# Patient Record
Sex: Female | Born: 1950 | Race: Black or African American | Hispanic: No | Marital: Married | Smoking: Never smoker
Health system: Southern US, Community
[De-identification: ages and names within clinical notes are randomized; demographics above are authoritative.]

## PROBLEM LIST (undated history)

## (undated) DIAGNOSIS — N189 Chronic kidney disease, unspecified: Secondary | ICD-10-CM

## (undated) DIAGNOSIS — I10 Essential (primary) hypertension: Secondary | ICD-10-CM

## (undated) DIAGNOSIS — R011 Cardiac murmur, unspecified: Secondary | ICD-10-CM

## (undated) DIAGNOSIS — I639 Cerebral infarction, unspecified: Secondary | ICD-10-CM

## (undated) DIAGNOSIS — M1990 Unspecified osteoarthritis, unspecified site: Secondary | ICD-10-CM

## (undated) HISTORY — PX: BRAIN SURGERY: SHX531

## (undated) HISTORY — PX: BUNIONECTOMY: SHX129

## (undated) HISTORY — PX: ANTERIOR CRUCIATE LIGAMENT REPAIR: SHX115

## (undated) HISTORY — PX: ABDOMINAL HYSTERECTOMY: SHX81

## (undated) HISTORY — PX: BACK SURGERY: SHX140

## (undated) HISTORY — PX: ARTHROPLASTY: SHX135

## (undated) HISTORY — PX: ANTERIOR CERVICAL DECOMP/DISCECTOMY FUSION: SHX1161

---

## 2018-12-17 ENCOUNTER — Encounter (HOSPITAL_BASED_OUTPATIENT_CLINIC_OR_DEPARTMENT_OTHER): Payer: Self-pay | Admitting: Emergency Medicine

## 2018-12-17 ENCOUNTER — Emergency Department (HOSPITAL_BASED_OUTPATIENT_CLINIC_OR_DEPARTMENT_OTHER): Payer: No Typology Code available for payment source

## 2018-12-17 ENCOUNTER — Emergency Department (HOSPITAL_BASED_OUTPATIENT_CLINIC_OR_DEPARTMENT_OTHER)
Admission: EM | Admit: 2018-12-17 | Discharge: 2018-12-17 | Disposition: A | Discharge status: Home / Self Care | Payer: No Typology Code available for payment source | Attending: Emergency Medicine | Admitting: Emergency Medicine

## 2018-12-17 ENCOUNTER — Other Ambulatory Visit: Payer: Self-pay

## 2018-12-17 DIAGNOSIS — I10 Essential (primary) hypertension: Secondary | ICD-10-CM | POA: Diagnosis not present

## 2018-12-17 DIAGNOSIS — J181 Lobar pneumonia, unspecified organism: Secondary | ICD-10-CM | POA: Insufficient documentation

## 2018-12-17 DIAGNOSIS — R05 Cough: Secondary | ICD-10-CM | POA: Diagnosis present

## 2018-12-17 DIAGNOSIS — J189 Pneumonia, unspecified organism: Secondary | ICD-10-CM

## 2018-12-17 HISTORY — DX: Essential (primary) hypertension: I10

## 2018-12-17 HISTORY — DX: Cerebral infarction, unspecified: I63.9

## 2018-12-17 LAB — BASIC METABOLIC PANEL
Anion gap: 8 (ref 5–15)
BUN: 17 mg/dL (ref 8–23)
CO2: 24 mmol/L (ref 22–32)
Calcium: 9.3 mg/dL (ref 8.9–10.3)
Chloride: 104 mmol/L (ref 98–111)
Creatinine, Ser: 1.13 mg/dL — ABNORMAL HIGH (ref 0.44–1.00)
GFR calc Af Amer: 57 mL/min — ABNORMAL LOW (ref >60)
GFR calc non Af Amer: 50 mL/min — ABNORMAL LOW (ref >60)
Glucose, Bld: 113 mg/dL — ABNORMAL HIGH (ref 70–99)
Potassium: 3.6 mmol/L (ref 3.5–5.1)
Sodium: 136 mmol/L (ref 135–145)

## 2018-12-17 LAB — I-STAT CG4 LACTIC ACID, ED: Lactic Acid, Venous: 1.41 mmol/L (ref 0.5–1.9)

## 2018-12-17 LAB — CBC WITH DIFFERENTIAL/PLATELET
Abs Immature Granulocytes: 0.04 10*3/uL (ref 0.00–0.07)
Basophils Absolute: 0 10*3/uL (ref 0.0–0.1)
Basophils Relative: 0 %
Eosinophils Absolute: 0.1 10*3/uL (ref 0.0–0.5)
Eosinophils Relative: 0 %
HEMATOCRIT: 41 % (ref 36.0–46.0)
Hemoglobin: 13.4 g/dL (ref 12.0–15.0)
Immature Granulocytes: 0 %
Lymphocytes Relative: 6 %
Lymphs Abs: 0.7 10*3/uL (ref 0.7–4.0)
MCH: 29 pg (ref 26.0–34.0)
MCHC: 32.7 g/dL (ref 30.0–36.0)
MCV: 88.7 fL (ref 80.0–100.0)
MONO ABS: 0.9 10*3/uL (ref 0.1–1.0)
Monocytes Relative: 8 %
Neutro Abs: 10 10*3/uL — ABNORMAL HIGH (ref 1.7–7.7)
Neutrophils Relative %: 86 %
Platelets: 192 10*3/uL (ref 150–400)
RBC: 4.62 MIL/uL (ref 3.87–5.11)
RDW: 13.2 % (ref 11.5–15.5)
WBC: 11.7 10*3/uL — ABNORMAL HIGH (ref 4.0–10.5)
nRBC: 0 % (ref 0.0–0.2)

## 2018-12-17 MED ORDER — SODIUM CHLORIDE 0.9 % IV BOLUS
1000.0000 mL | Freq: Once | INTRAVENOUS | Status: AC
  Administered 2018-12-17: 1000 mL via INTRAVENOUS

## 2018-12-17 MED ORDER — AMOXICILLIN-POT CLAVULANATE 875-125 MG PO TABS: 1.0000 | ORAL_TABLET | Freq: Two times a day (BID) | ORAL | 0 refills | Status: AC

## 2018-12-17 MED ORDER — ONDANSETRON 4 MG PO TBDP: 4.0000 mg | ORAL_TABLET | Freq: Three times a day (TID) | ORAL | 0 refills | Status: DC | PRN

## 2018-12-17 MED ORDER — AZITHROMYCIN 250 MG PO TABS: 250.0000 mg | ORAL_TABLET | Freq: Every day | ORAL | 0 refills | Status: DC

## 2018-12-17 MED ORDER — FLUCONAZOLE 150 MG PO TABS: 150.0000 mg | ORAL_TABLET | Freq: Every day | ORAL | 0 refills | Status: AC

## 2018-12-17 NOTE — ED Notes (Signed)
Pt states she has been having generalized body aches, fever and cough x 3 days. She states she has poor appetite, but denies nausea, vomiting or diarrhea. Pt is hoarse when speaking. States she is coughing up yellow mucus. She states she has chest and nasal congestion.

## 2018-12-17 NOTE — ED Triage Notes (Signed)
Body aches with headache, fever and coughing x 3 days.

## 2018-12-17 NOTE — ED Notes (Signed)
ED Provider at bedside. 

## 2018-12-17 NOTE — ED Notes (Signed)
Blood cultures drawn and at bedside.

## 2018-12-17 NOTE — Discharge Instructions (Addendum)
Drink plenty of fluids, at least 8 glasses daily

## 2018-12-17 NOTE — ED Provider Notes (Signed)
[facility] EMERGENCY DEPARTMENT Provider Note   CSN: [id number] Arrival date & time: 12/17/18  1157     History   Chief Complaint Chief Complaint  Patient presents with  . Generalized Body Aches    HPI Whitney Hess is a 67 y.o. female.  HPI   67 yo F with h/o HTN, CVA here w/ generalized weakness, cough, fever. Pt reports that for the last several days, she's had acute onset of body aches, cough, and fatigue. She's been trying to manage it at home but notes that her sx are worsening so she's here today. Pt states her sx started around 3 days ago, ar eprogressively worsening. Her cough is productive of yellow mucus. She's had poor appetite but no nausea or vomiting. Her cough, SOB are worse w/ movement, exposure to cold. No alleviating factors. 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 No h/o lung problems. She notes her BP was low at home but is now improved, denies any syncope or lightheadedness.  Past Medical History:  Diagnosis Date  . Hypertension   . Stroke [facility](HCC)     There are no active problems to display for this patient.   Past Surgical History:  Procedure Laterality Date  . ABDOMINAL HYSTERECTOMY    . BRAIN SURGERY       OB History   No obstetric history on file.      Home Medications    Prior to Admission medications   Medication Sig Start Date End Date Taking? Authorizing Provider  amoxicillin-clavulanate (AUGMENTIN) 875-125 MG tablet Take 1 tablet by mouth every 12 (twelve) hours for 7 days. 12/17/18 12/24/18  Shaune PollackIsaacs, Aquil Duhe, MD  azithromycin (ZITHROMAX) 250 MG tablet Take 1 tablet (250 mg total) by mouth daily. Take first 2 tablets together, then 1 every day until finished. 12/17/18   Shaune PollackIsaacs, Dolph Tavano, MD  fluconazole (DIFLUCAN) 150 MG tablet Take 1 tablet (150 mg total) by mouth daily for 1 day. 12/17/18 12/18/18  Shaune PollackIsaacs, Guinn Delarosa, MD  ondansetron (ZOFRAN ODT) 4 MG disintegrating tablet Take 1 tablet (4 mg total) by mouth  every 8 (eight) hours as needed for nausea or vomiting. 12/17/18   Shaune PollackIsaacs, Brenda Samano, MD    Family History History reviewed. No pertinent family history.  Social History Social History   Tobacco Use  . Smoking status: Never Smoker  . Smokeless tobacco: Never Used  Substance Use Topics  . Alcohol use: Never    Frequency: Never  . Drug use: Never     Allergies   Codeine   Review of Systems Review of Systems  Constitutional: Positive for fatigue. Negative for chills and fever.  HENT: Positive for congestion, rhinorrhea and sore throat.   Eyes: Negative for visual disturbance.  Respiratory: Positive for cough. Negative for shortness of breath and wheezing.   Cardiovascular: Negative for chest pain and leg swelling.  Gastrointestinal: Negative for abdominal pain, diarrhea, nausea and vomiting.  Genitourinary: Negative for dysuria, flank pain, vaginal bleeding and vaginal discharge.  Musculoskeletal: Negative for neck pain.  Skin: Negative for rash.  Allergic/Immunologic: Negative for immunocompromised state.  Neurological: Positive for weakness. Negative for syncope and headaches.  Hematological: Does not bruise/bleed easily.  All other systems reviewed and are negative.    Physical Exam Updated Vital Signs BP (!) 110/58 (BP Location: Right Arm)   Pulse 86   Temp 98.7 F (37.1 C) (Oral)   Resp 16   Ht 5\' 9"  (1.753 m)   Wt 97.1 kg  SpO2 97%   BMI 31.60 kg/m   Physical Exam Vitals signs and nursing note reviewed.  Constitutional:      General: She is not in acute distress.    Appearance: She is well-developed.  HENT:     Head: Normocephalic and atraumatic.     Comments: Mildly dry MM. Posterior pharyngeal erythema w/o tonsillar swelling or exudates.     Mouth/Throat:     Mouth: Mucous membranes are dry.  Eyes:     Conjunctiva/sclera: Conjunctivae normal.  Neck:     Musculoskeletal: Neck supple.  Cardiovascular:     Rate and Rhythm: Normal rate and regular  rhythm.     Heart sounds: Normal heart sounds. No murmur. No friction rub.  Pulmonary:     Effort: Pulmonary effort is normal. No respiratory distress.     Breath sounds: Examination of the right-upper field reveals rales. Rales present. No wheezing.     Comments: Normal WOB, speaking in full sentences Abdominal:     General: There is no distension.     Palpations: Abdomen is soft.     Tenderness: There is no abdominal tenderness.  Skin:    General: Skin is warm.     Capillary Refill: Capillary refill takes less than 2 seconds.  Neurological:     Mental Status: She is alert and oriented to person, place, and time.     Motor: No abnormal muscle tone.      ED Treatments / Results  Labs (all labs ordered are listed, but only abnormal results are displayed) Labs Reviewed  CBC WITH DIFFERENTIAL/PLATELET - Abnormal; Notable for the following components:      Result Value   WBC 11.7 (*)    Neutro Abs 10.0 (*)    All other components within normal limits  BASIC METABOLIC PANEL - Abnormal; Notable for the following components:   Glucose, Bld 113 (*)    Creatinine, Ser 1.13 (*)    GFR calc non Af Amer 50 (*)    GFR calc Af Amer 57 (*)    All other components within normal limits  I-STAT CG4 LACTIC ACID, ED  I-STAT CG4 LACTIC ACID, ED    EKG None  Radiology Dg Chest 2 View  Result Date: 12/17/2018 CLINICAL DATA:  Shortness of breath, cough, congestion, fever and headache for 3 days, history hypertension, stroke EXAM: CHEST - 2 VIEW COMPARISON:  None FINDINGS: Enlargement of cardiac silhouette with pulmonary vascular congestion. Mediastinal contours normal. Minimal RIGHT basilar atelectasis. Lungs otherwise clear. No pleural effusion or pneumothorax. Prior cervical and lumbar spine fusion procedures. Degenerative disc disease changes thoracic spine. No acute osseous findings. IMPRESSION: Enlargement of cardiac silhouette with pulmonary vascular congestion. Minimal RIGHT basilar  atelectasis. Electronically Signed   By: Ulyses Southward M.D.   On: 12/17/2018 13:42    Procedures Procedures (including critical care time)  Medications Ordered in ED Medications  sodium chloride 0.9 % bolus 1,000 mL (0 mLs Intravenous Stopped 12/17/18 1342)     Initial Impression / Assessment and Plan / ED Course  I have reviewed the triage vital signs and the nursing notes.  Pertinent labs & imaging results that were available during my care of the patient were reviewed by me and considered in my medical decision making (see chart for details).    67 yo F here w/ cough, SOB, body aches. On arrival here, she is very well appearing and in NAD. She has multiple sick contacts at work. Labs show mild leukocytosis but normal  LA, reassuring lytes. Doubt sepsis. She is afebrile and HDS here. CXR does show possible mild infiltrate, will tx as PNA to be safe. Suspect viral URi with possible early CAP. Pt requesting d/c and I think it's reasonable given low-risk presentation and labs. PSI is Risk Class II. Shared decision making employed and pt states she would prefer outpt management. Will have her push fluids, hold her antihypertensives, and return with any worsenin sx.  Final Clinical Impressions(s) / ED Diagnoses   Final diagnoses:  Pneumonia of right lower lobe due to infectious organism [facility](HCC)    ED Discharge Orders         Ordered    amoxicillin-clavulanate (AUGMENTIN) 875-125 MG tablet  Every 12 hours     12/17/18 1426    ondansetron (ZOFRAN ODT) 4 MG disintegrating tablet  Every 8 hours PRN     12/17/18 1426    azithromycin (ZITHROMAX) 250 MG tablet  Daily     12/17/18 1426    fluconazole (DIFLUCAN) 150 MG tablet  Daily     12/17/18 1434           Shaune PollackIsaacs, Randol Zumstein, MD 12/17/18 1547

## 2018-12-17 NOTE — ED Notes (Signed)
Patient transported to X-ray 

## 2020-06-12 IMAGING — CR DG CHEST 2V
2 series · 2 of 2 positions shown · non-contrast
Comparison: None

CLINICAL DATA: Shortness of breath, cough, congestion, fever and
headache for 3 days, history hypertension, stroke

EXAM:
CHEST - 2 VIEW

[w chest pa]
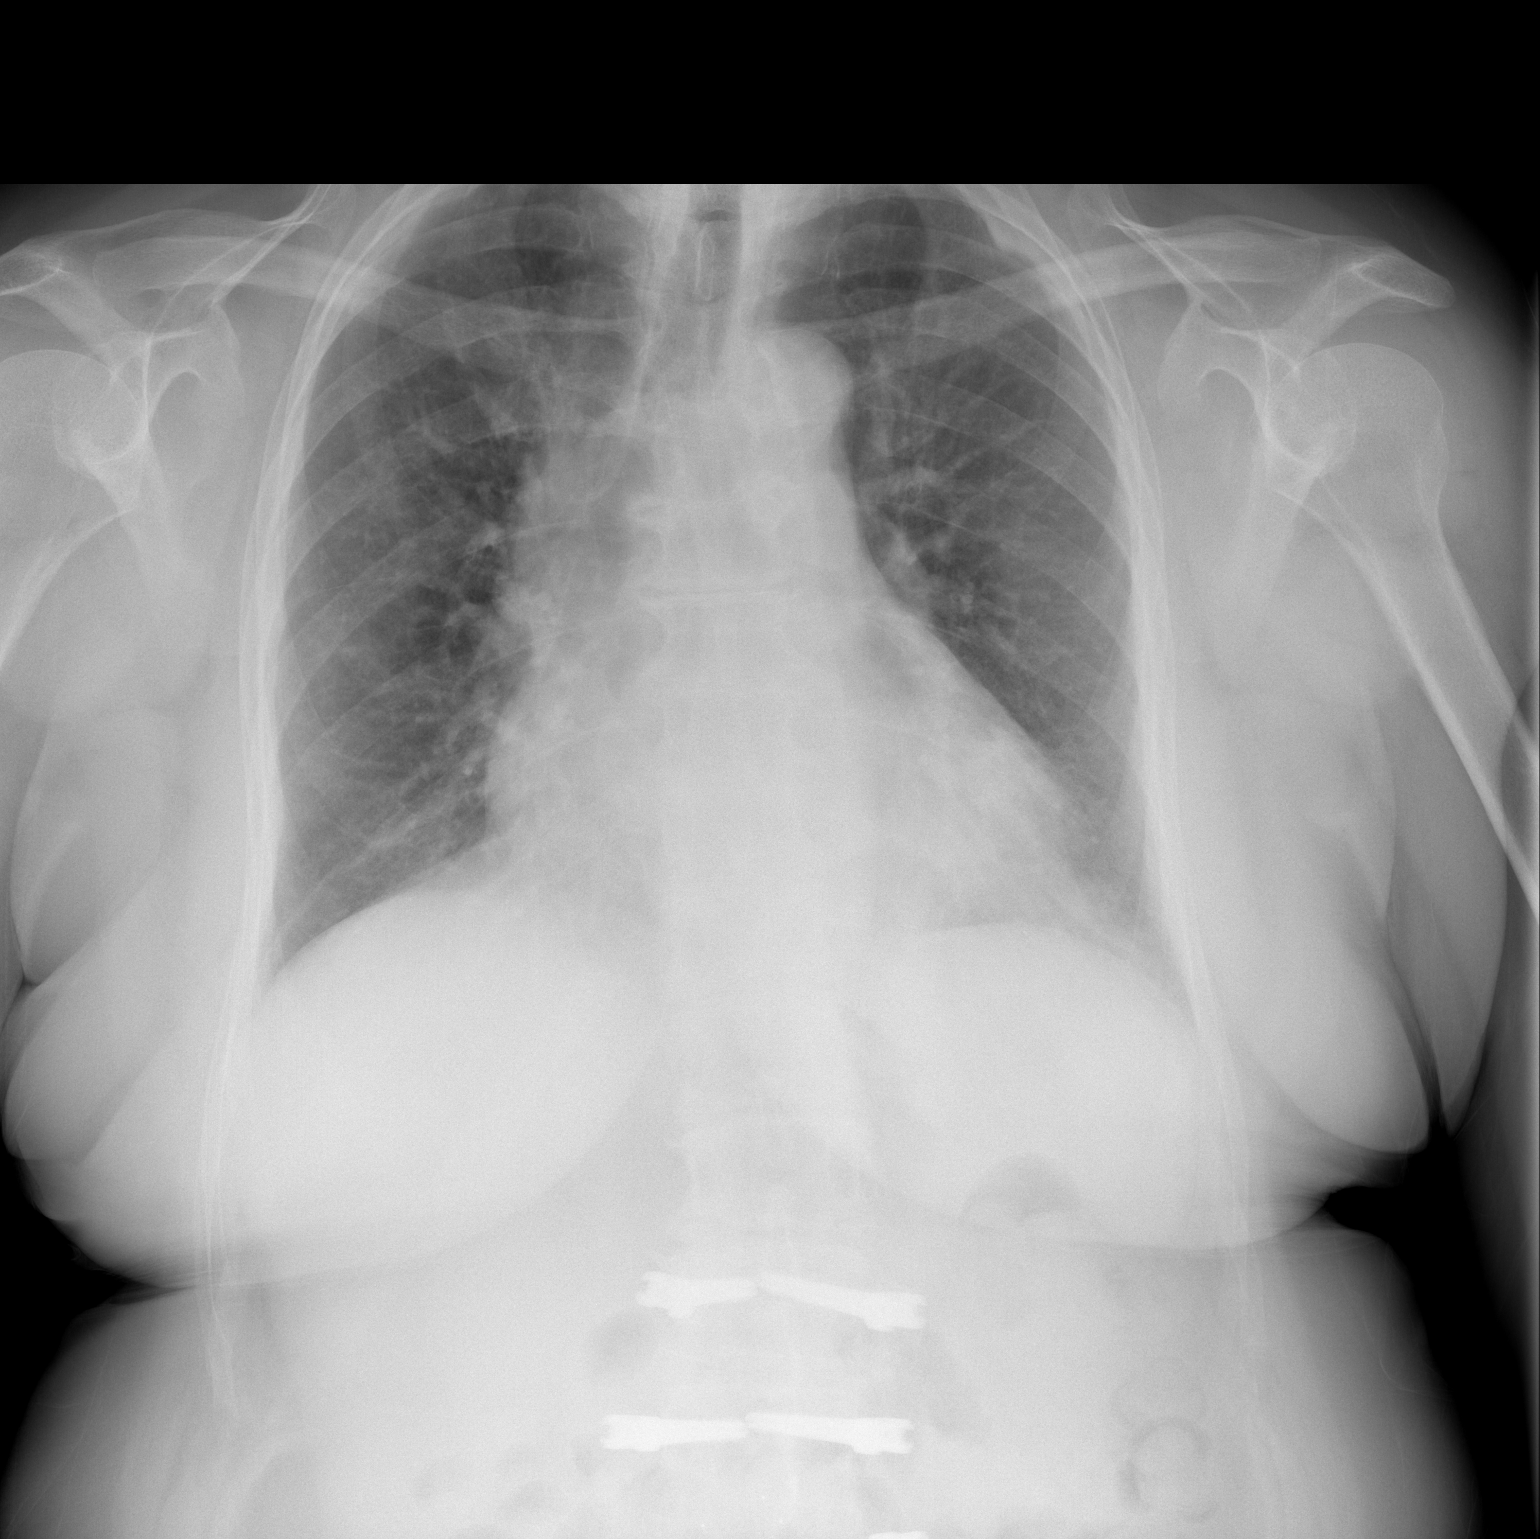

[w chest lat]
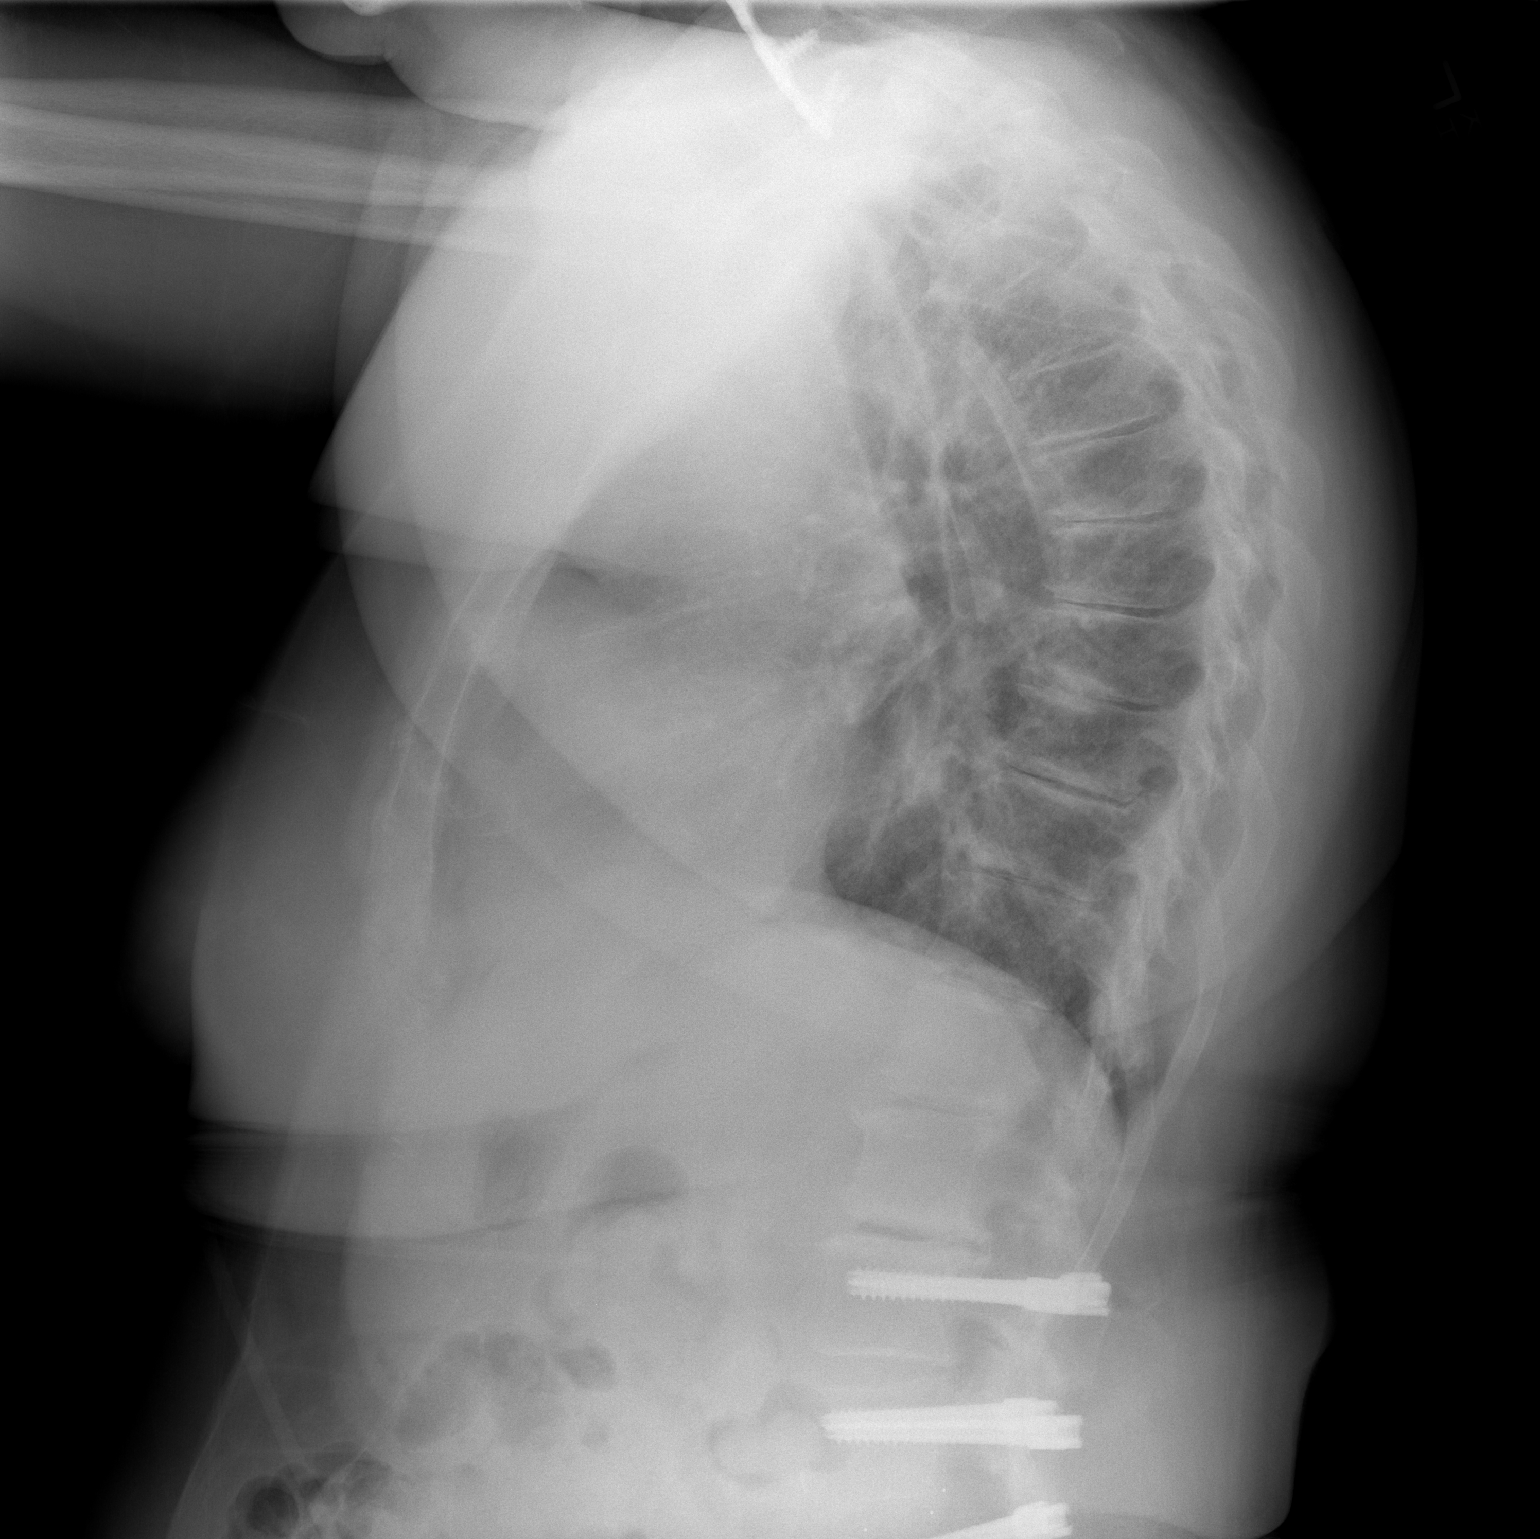

[2 of 2 positions shown; findings below may reference images not displayed]

FINDINGS: Enlargement of cardiac silhouette with pulmonary vascular
congestion.

Mediastinal contours normal.

Minimal RIGHT basilar atelectasis.

Lungs otherwise clear.

No pleural effusion or pneumothorax.

Prior cervical and lumbar spine fusion procedures.

Degenerative disc disease changes thoracic spine.

No acute osseous findings.
IMPRESSION: Enlargement of cardiac silhouette with pulmonary vascular
congestion.

Minimal RIGHT basilar atelectasis.

## 2021-08-10 ENCOUNTER — Encounter (HOSPITAL_BASED_OUTPATIENT_CLINIC_OR_DEPARTMENT_OTHER): Payer: Self-pay | Admitting: Emergency Medicine

## 2021-08-10 ENCOUNTER — Emergency Department (HOSPITAL_BASED_OUTPATIENT_CLINIC_OR_DEPARTMENT_OTHER)
Admission: EM | Admit: 2021-08-10 | Discharge: 2021-08-10 | Disposition: A | Discharge status: Home / Self Care | Payer: Medicare HMO | Attending: Emergency Medicine | Admitting: Emergency Medicine

## 2021-08-10 ENCOUNTER — Other Ambulatory Visit: Payer: Self-pay

## 2021-08-10 DIAGNOSIS — I10 Essential (primary) hypertension: Secondary | ICD-10-CM | POA: Insufficient documentation

## 2021-08-10 DIAGNOSIS — Z79899 Other long term (current) drug therapy: Secondary | ICD-10-CM | POA: Insufficient documentation

## 2021-08-10 DIAGNOSIS — Z20822 Contact with and (suspected) exposure to covid-19: Secondary | ICD-10-CM | POA: Diagnosis not present

## 2021-08-10 DIAGNOSIS — R519 Headache, unspecified: Secondary | ICD-10-CM | POA: Diagnosis present

## 2021-08-10 DIAGNOSIS — M542 Cervicalgia: Secondary | ICD-10-CM | POA: Insufficient documentation

## 2021-08-10 DIAGNOSIS — Z7982 Long term (current) use of aspirin: Secondary | ICD-10-CM | POA: Insufficient documentation

## 2021-08-10 MED ORDER — METHOCARBAMOL 500 MG PO TABS: 500.0000 mg | ORAL_TABLET | Freq: Three times a day (TID) | ORAL | 0 refills | Status: DC | PRN

## 2021-08-10 MED ORDER — KETOROLAC TROMETHAMINE 60 MG/2ML IM SOLN
30.0000 mg | Freq: Once | INTRAMUSCULAR | Status: AC
  Administered 2021-08-10: 30 mg via INTRAMUSCULAR
  Filled 2021-08-10: qty 2

## 2021-08-10 MED ORDER — METHOCARBAMOL 500 MG PO TABS
500.0000 mg | ORAL_TABLET | Freq: Once | ORAL | Status: AC
  Administered 2021-08-10: 500 mg via ORAL
  Filled 2021-08-10: qty 1

## 2021-08-10 NOTE — ED Provider Notes (Signed)
[facility] EMERGENCY DEPARTMENT Provider Note   CSN: [id number] Arrival date & time: 08/10/21  0503     History Chief Complaint  Patient presents with   Headache    Whitney Hess is a 70 y.o. female.   Headache Pain location:  Occipital Quality:  Dull, sharp and stabbing Radiates to:  Does not radiate Severity currently:  Unable to specify Severity at highest:  Unable to specify Onset quality:  Unable to specify Duration:  2 days Timing:  Intermittent Progression:  Waxing and waning Chronicity:  New Similar to prior headaches: yes   Context: emotional stress   Context: not activity and not eating   Relieved by:  Nothing Worsened by:  Nothing Ineffective treatments:  None tried Associated symptoms: no abdominal pain and no eye pain       Past Medical History:  Diagnosis Date   Hypertension    Stroke (HCC)     There are no problems to display for this patient.   Past Surgical History:  Procedure Laterality Date   ABDOMINAL HYSTERECTOMY     BACK SURGERY     BRAIN SURGERY       OB History   No obstetric history on file.     No family history on file.  Social History   Tobacco Use   Smoking status: Never   Smokeless tobacco: Never  Vaping Use   Vaping Use: Never used  Substance Use Topics   Alcohol use: Never   Drug use: Never    Home Medications Prior to Admission medications   Medication Sig Start Date End Date Taking? Authorizing Provider  aspirin 325 MG tablet Take 325 mg by mouth daily.   Yes [provider]  Biotin 1 MG CAPS Take by mouth.   Yes [provider]  calcium carbonate (OSCAL) 1500 (600 Ca) MG TABS tablet Take by mouth 2 (two) times daily with a meal.   Yes [provider]  colchicine 0.6 MG tablet Take 0.6 mg by mouth daily.   Yes [provider]  Flaxseed, Linseed, (FLAXSEED OIL PO) Take by mouth.   Yes [provider]  furosemide (LASIX) 40 MG tablet Take 40 mg by  mouth.   Yes [provider]  L-Lysine HCl 1000 MG TABS Take by mouth.   Yes [provider]  loratadine (CLARITIN) 10 MG tablet Take 10 mg by mouth daily.   Yes [provider]  losartan (COZAAR) 100 MG tablet Take 100 mg by mouth daily.   Yes [provider]  Magnesium 250 MG TABS Take by mouth.   Yes [provider]  methocarbamol (ROBAXIN) 500 MG tablet Take 1 tablet (500 mg total) by mouth every 8 (eight) hours as needed for muscle spasms. 08/10/21  Yes Jawon Dipiero, Barbara Cower, MD  metoprolol succinate (TOPROL-XL) 50 MG 24 hr tablet Take 50 mg by mouth daily. Take with or immediately following a meal.   Yes [provider]  pantoprazole (PROTONIX) 40 MG tablet Take 40 mg by mouth daily.   Yes [provider]  potassium chloride SA (KLOR-CON) 20 MEQ tablet Take 20 mEq by mouth 2 (two) times daily.   Yes [provider]  pravastatin (PRAVACHOL) 20 MG tablet Take 20 mg by mouth daily.   Yes [provider]  Selenium 200 MCG CAPS Take by mouth.   Yes [provider]  vitamin C (ASCORBIC ACID) 500 MG tablet Take 500 mg by mouth daily.   Yes [provider]  Zinc 50 MG CAPS Take by mouth.   Yes [provider]  azithromycin (ZITHROMAX) 250 MG tablet Take 1 tablet (250 mg total) by mouth daily. Take first 2 tablets together, then 1 every day until finished. 12/17/18   Shaune Pollack, MD  ondansetron (ZOFRAN ODT) 4 MG disintegrating tablet Take 1 tablet (4 mg total) by mouth every 8 (eight) hours as needed for nausea or vomiting. 12/17/18   Shaune Pollack, MD    Allergies    Codeine  Review of Systems   Review of Systems  Eyes:  Negative for pain.  Gastrointestinal:  Negative for abdominal pain.  Neurological:  Positive for headaches.  All other systems reviewed and are negative.  Physical Exam Updated Vital Signs BP (!) 149/84   Pulse 62   Temp 98.6 F (37 C) (Oral)   Resp 18   Ht 5'  9.5" (1.765 m)   Wt 98 kg   SpO2 99%   BMI 31.44 kg/m   Physical Exam Vitals and nursing note reviewed.  Constitutional:      Appearance: She is well-developed.  HENT:     Head: Normocephalic and atraumatic.  Eyes:     General: No visual field deficit. Cardiovascular:     Rate and Rhythm: Normal rate and regular rhythm.  Pulmonary:     Effort: No respiratory distress.     Breath sounds: No stridor.  Abdominal:     General: There is no distension.  Musculoskeletal:        General: Tenderness (neck) present.     Cervical back: Normal range of motion. No rigidity.  Skin:    General: Skin is warm and dry.  Neurological:     Mental Status: She is alert.     GCS: GCS eye subscore is 4. GCS verbal subscore is 5. GCS motor subscore is 6.     Cranial Nerves: No cranial nerve deficit, dysarthria or facial asymmetry.     Sensory: No sensory deficit.     Motor: No weakness.     Coordination: Coordination normal.     Gait: Gait normal.    ED Results / Procedures / Treatments   Labs (all labs ordered are listed, but only abnormal results are displayed) Labs Reviewed  SARS CORONAVIRUS 2 (TAT 6-24 HRS)    EKG None  Radiology No results found.  Procedures Procedures   Medications Ordered in ED Medications  ketorolac (TORADOL) injection 30 mg (30 mg Intramuscular Given 08/10/21 0642)  methocarbamol (ROBAXIN) tablet 500 mg (500 mg Oral Given 08/10/21 6761)    ED Course  I have reviewed the triage vital signs and the nursing notes.  Pertinent labs & imaging results that were available during my care of the patient were reviewed by me and considered in my medical decision making (see chart for details).    MDM Rules/Calculators/A&P                           No neurologic changes to suggest stroke or cervical issues.  No infectious symptoms to suggest meningitis.  Has had a lot of stress, difficulty sleeping and has been sleeping in a chair taking a lot of cat naps so  seems like it is probalby muscular. Considered occipital neuralgia, however pushing on occiput did not elicit symptoms.  Will treat symptomatically with pcp follow up.   Final Clinical Impression(s) / ED Diagnoses Final diagnoses:  Neck pain    Rx /  DC Orders ED Discharge Orders          Ordered    methocarbamol (ROBAXIN) 500 MG tablet  Every 8 hours PRN        08/10/21 0646             Truitt Cruey, Barbara Cower, MD 08/10/21 [phone]

## 2021-08-10 NOTE — ED Triage Notes (Signed)
Reports HA since Thursday "from the back of the neck to top of the head". Pt states worse when laying down. Denies hx of migraines or frequent HA. Denies photophobia, visual changes or stiffness in neck. Reports flare up of "fisherman's shingles" in R hand.

## 2021-08-11 LAB — SARS CORONAVIRUS 2 (TAT 6-24 HRS): SARS Coronavirus 2: NEGATIVE

## 2022-07-07 ENCOUNTER — Encounter (HOSPITAL_BASED_OUTPATIENT_CLINIC_OR_DEPARTMENT_OTHER): Payer: Self-pay | Admitting: Emergency Medicine

## 2022-07-07 ENCOUNTER — Other Ambulatory Visit: Payer: Self-pay

## 2022-07-07 ENCOUNTER — Emergency Department (HOSPITAL_BASED_OUTPATIENT_CLINIC_OR_DEPARTMENT_OTHER)
Admission: EM | Admit: 2022-07-07 | Discharge: 2022-07-07 | Disposition: A | Discharge status: Home / Self Care | Payer: Medicare HMO | Attending: Emergency Medicine | Admitting: Emergency Medicine

## 2022-07-07 DIAGNOSIS — R300 Dysuria: Secondary | ICD-10-CM | POA: Diagnosis present

## 2022-07-07 DIAGNOSIS — N3001 Acute cystitis with hematuria: Secondary | ICD-10-CM | POA: Diagnosis not present

## 2022-07-07 DIAGNOSIS — Z7982 Long term (current) use of aspirin: Secondary | ICD-10-CM | POA: Diagnosis not present

## 2022-07-07 LAB — URINALYSIS, MICROSCOPIC (REFLEX)
RBC / HPF: >50 RBC/hpf (ref 0–5)
WBC, UA: >50 WBC/hpf (ref 0–5)

## 2022-07-07 LAB — CBC WITH DIFFERENTIAL/PLATELET
Abs Immature Granulocytes: 0.02 10*3/uL (ref 0.00–0.07)
Basophils Absolute: 0 10*3/uL (ref 0.0–0.1)
Basophils Relative: 1 %
Eosinophils Absolute: 0.2 10*3/uL (ref 0.0–0.5)
Eosinophils Relative: 2 %
HCT: 38.9 % (ref 36.0–46.0)
Hemoglobin: 12.7 g/dL (ref 12.0–15.0)
Immature Granulocytes: 0 %
Lymphocytes Relative: 16 %
Lymphs Abs: 1.3 10*3/uL (ref 0.7–4.0)
MCH: 28.9 pg (ref 26.0–34.0)
MCHC: 32.6 g/dL (ref 30.0–36.0)
MCV: 88.6 fL (ref 80.0–100.0)
Monocytes Absolute: 0.7 10*3/uL (ref 0.1–1.0)
Monocytes Relative: 8 %
Neutro Abs: 6 10*3/uL (ref 1.7–7.7)
Neutrophils Relative %: 73 %
Platelets: 221 10*3/uL (ref 150–400)
RBC: 4.39 MIL/uL (ref 3.87–5.11)
RDW: 13.8 % (ref 11.5–15.5)
WBC: 8.2 10*3/uL (ref 4.0–10.5)
nRBC: 0 % (ref 0.0–0.2)

## 2022-07-07 LAB — URINALYSIS, ROUTINE W REFLEX MICROSCOPIC

## 2022-07-07 LAB — BASIC METABOLIC PANEL
Anion gap: 5 (ref 5–15)
BUN: 15 mg/dL (ref 8–23)
CO2: 28 mmol/L (ref 22–32)
Calcium: 9.1 mg/dL (ref 8.9–10.3)
Chloride: 107 mmol/L (ref 98–111)
Creatinine, Ser: 1.1 mg/dL — ABNORMAL HIGH (ref 0.44–1.00)
GFR, Estimated: 53 mL/min — ABNORMAL LOW (ref >60)
Glucose, Bld: 97 mg/dL (ref 70–99)
Potassium: 4.1 mmol/L (ref 3.5–5.1)
Sodium: 140 mmol/L (ref 135–145)

## 2022-07-07 MED ORDER — CEPHALEXIN 500 MG PO CAPS: 500.0000 mg | ORAL_CAPSULE | Freq: Three times a day (TID) | ORAL | 0 refills | Status: DC

## 2022-07-07 NOTE — ED Notes (Signed)
Discharge instructions reviewed with patient. Patient verbalizes understanding, no further questions at this time. Medications/prescriptions and follow up information provided. No acute distress noted at time of departure.  

## 2022-07-07 NOTE — ED Notes (Signed)
Lab notified of urine orders

## 2022-07-07 NOTE — ED Triage Notes (Signed)
Dyuria x 2 days.  Pt noticed blood in her urine.  No known fevers.  Burning with urination, bladder spasms, and urethral issues.  No back issues.

## 2022-07-07 NOTE — ED Provider Notes (Signed)
[facility] EMERGENCY DEPARTMENT Provider Note   CSN: [id number] Arrival date & time: 07/07/22  1006     History  Chief Complaint  Patient presents with   Dysuria    Whitney Hess is a 71 y.o. female with a past medical history of urinary incontinence, rectocele status post bladder sling presenting today with concern for UTI she reports that she has noted some dysuria for 2 days and this morning she noticed blood in her urine.  No frequency or urgency.  No fever, chills, back pain, nausea, vomiting.   Dysuria Associated symptoms: no fever and no vaginal discharge        Home Medications Prior to Admission medications   Medication Sig Start Date End Date Taking? Authorizing Provider  Alpha-Lipoic Acid 300 MG CAPS Take by mouth.    [provider]  ascorbic acid (VITAMIN C) 500 MG tablet Take by mouth.    [provider]  aspirin 325 MG tablet Take 325 mg by mouth daily.    [provider]  Biotin 1 MG CAPS Take by mouth.    [provider]  Biotin 1000 MCG tablet Take by mouth.    [provider]  calcium carbonate (OSCAL) 1500 (600 Ca) MG TABS tablet Take by mouth 2 (two) times daily with a meal.    [provider]  colchicine 0.6 MG tablet Take 0.6 mg by mouth daily.    [provider]  Flaxseed, Linseed, (FLAXSEED OIL PO) Take by mouth.    [provider]  furosemide (LASIX) 40 MG tablet Take 40 mg by mouth.    [provider]  L-Lysine HCl 1000 MG TABS Take by mouth.    [provider]  loratadine (CLARITIN) 10 MG tablet Take 10 mg by mouth daily.    [provider]  losartan (COZAAR) 100 MG tablet Take 100 mg by mouth daily.    [provider]  Magnesium 250 MG TABS Take by mouth.    [provider]  metoprolol succinate (TOPROL-XL) 50 MG 24 hr tablet Take 50 mg by mouth daily. Take with or immediately following a meal.    [provider]   potassium chloride SA (KLOR-CON) 20 MEQ tablet Take 20 mEq by mouth 2 (two) times daily.    [provider]  pravastatin (PRAVACHOL) 20 MG tablet Take 20 mg by mouth daily.    [provider]  Selenium 200 MCG CAPS Take by mouth.    [provider]  vitamin C (ASCORBIC ACID) 500 MG tablet Take 500 mg by mouth daily.    [provider]  Zinc 50 MG CAPS Take by mouth.    [provider]  zinc gluconate 50 MG tablet Take by mouth.    [provider]      Allergies    Codeine, Amlodipine besylate, Sulfamethoxazole-trimethoprim, and Tramadol    Review of Systems   Review of Systems  Constitutional:  Negative for chills and fever.  Genitourinary:  Positive for dysuria and hematuria. Negative for vaginal bleeding, vaginal discharge and vaginal pain.    Physical Exam Updated Vital Signs BP 127/81   Pulse 60   Temp 97.8 F (36.6 C) (Oral)   Resp 18   Ht 5' 9.5" (1.765 m)   Wt 98 kg   SpO2 99%   BMI 31.44 kg/m  Physical Exam Vitals and nursing note reviewed.  Constitutional:      Appearance: Normal appearance.  HENT:  Head: Normocephalic and atraumatic.  Eyes:     General: No scleral icterus.    Conjunctiva/sclera: Conjunctivae normal.  Pulmonary:     Effort: Pulmonary effort is normal. No respiratory distress.  Abdominal:     General: Abdomen is flat.     Palpations: Abdomen is soft.     Tenderness: There is no abdominal tenderness.  Genitourinary:    Comments: Not indicated Skin:    Findings: No rash.  Neurological:     Mental Status: She is alert.  Psychiatric:        Mood and Affect: Mood normal.     ED Results / Procedures / Treatments   Labs (all labs ordered are listed, but only abnormal results are displayed) Labs Reviewed  URINALYSIS, ROUTINE W REFLEX MICROSCOPIC - Abnormal; Notable for the following components:      Result Value   Color, Urine BROWN (*)    APPearance TURBID (*)    Glucose, UA    (*)    Value: TEST NOT REPORTED DUE TO COLOR INTERFERENCE OF URINE PIGMENT   Hgb urine dipstick   (*)    Value: TEST NOT REPORTED DUE TO COLOR INTERFERENCE OF URINE PIGMENT   Bilirubin Urine   (*)    Value: TEST NOT REPORTED DUE TO COLOR INTERFERENCE OF URINE PIGMENT   Ketones, ur   (*)    Value: TEST NOT REPORTED DUE TO COLOR INTERFERENCE OF URINE PIGMENT   Protein, ur   (*)    Value: TEST NOT REPORTED DUE TO COLOR INTERFERENCE OF URINE PIGMENT   Nitrite   (*)    Value: TEST NOT REPORTED DUE TO COLOR INTERFERENCE OF URINE PIGMENT   Leukocytes,Ua   (*)    Value: TEST NOT REPORTED DUE TO COLOR INTERFERENCE OF URINE PIGMENT   All other components within normal limits  BASIC METABOLIC PANEL - Abnormal; Notable for the following components:   Creatinine, Ser 1.10 (*)    GFR, Estimated 53 (*)    All other components within normal limits  URINALYSIS, MICROSCOPIC (REFLEX) - Abnormal; Notable for the following components:   Bacteria, UA FEW (*)    Non Squamous Epithelial PRESENT (*)    All other components within normal limits  URINE CULTURE  CBC WITH DIFFERENTIAL/PLATELET    EKG None  Radiology No results found.  Procedures Procedures   Medications Ordered in ED Medications - No data to display  ED Course/ Medical Decision Making/ A&P                           Medical Decision Making Amount and/or Complexity of Data Reviewed Labs: ordered.   This patient presents to the ED for concern of dysuria and hematuria.  Differential includes but is not limited to cystitis, pyelonephritis, malignancy, nephritic syndrome, nephrotic syndrome.   This is not an exhaustive differential.    Past Medical History / Co-morbidities / Social History: Bladder sling, recurrent UTI   Additional history: Per external chart review, patient was following with urology after her bladder sling procedure.  She had recurrent urinary tract infections requiring treatment.   Physical Exam: No  pertinent exam findings  Lab Tests: I ordered, and personally interpreted labs.   -UA with bacteriuria and hematuria. -No white blood cell count -Creatinine 1.10, was 1.14 in August of last year.    Disposition: 71 year old female presented with dysuria and hematuria.  Urinalysis seems hematuric with signs of infection.  She has no  with pyelonephritis and a normal white count.  Afebrile.  Doubt nephrolithiasis causing her symptoms at this time.  After consideration of her work-up, I feel she is stable for discharge home.   I discussed this case with my attending physician Dr. Dalene Seltzer who cosigned this note including patient's presenting symptoms, physical exam, and planned diagnostics and interventions. Attending physician stated agreement with plan or made changes to plan which were implemented.     Final Clinical Impression(s) / ED Diagnoses Final diagnoses:  Acute cystitis with hematuria    Rx / DC Orders ED Discharge Orders          Ordered    cephALEXin (KEFLEX) 500 MG capsule  3 times daily,   Status:  Discontinued        07/07/22 1216    cephALEXin (KEFLEX) 500 MG capsule  3 times daily        07/07/22 1216           Results and diagnoses were explained to the patient. Return precautions discussed in full. Patient had no additional questions and expressed complete understanding.   This chart was dictated using voice recognition software.  Despite best efforts to proofread,  errors can occur which can change the documentation meaning.    Woodroe Chen 07/07/22 1221    Alvira Monday, MD 07/08/22 2354

## 2022-07-07 NOTE — Discharge Instructions (Addendum)
You do have a UTI.  Take the antibiotics as prescribed.  They may upset your stomach so take them with food.  Return with any worsening symptoms, especially fevers, chills, abdominal pain, back pain, nausea or vomiting.  If you continue to have these urinary tract infections you should see your primary care or your urologist.  It was a pleasure to meet you and I hope that you feel better.

## 2022-07-09 LAB — URINE CULTURE: Culture: >=100000 — AB

## 2022-07-10 ENCOUNTER — Telehealth: Payer: Self-pay

## 2022-07-10 NOTE — Telephone Encounter (Signed)
Post ED Visit - Positive Culture Follow-up  Culture report reviewed by antimicrobial stewardship pharmacist: [facility] [x]  [place], [place].D. []  [place], Pharm.D., BCPS AQ-ID []  , Pharm.D., BCPS []  Celedonio Miyamoto, Pharm.D., BCPS []  [place], Garvin Fila.D., BCPS, AAHIVP []  , Pharm.D., BCPS, AAHIVP []  Georgina Pillion, PharmD, BCPS []  , PharmD, BCPS []  [place], PharmD, BCPS []  [address], PharmD []  , PharmD, BCPS []  Estella Husk, PharmD  Pharmacy Team []  Lysle Pearl, PharmD []  , PharmD []  Phillips Climes, PharmD []  , Rph []  Agapito Games) , PharmD []  Verlan Friends, PharmD []  , PharmD []  Mervyn Gay, PharmD []  , PharmD []  Vinnie Level, PharmD []  [facility], PharmD []  , PharmD []  Len Childs, PharmD   Positive urine culture Treated with Cephalexin, organism sensitive to the same and no further patient follow-up is required at this time.  07/10/2022, 4:47 PM

## 2024-04-05 ENCOUNTER — Emergency Department (HOSPITAL_BASED_OUTPATIENT_CLINIC_OR_DEPARTMENT_OTHER)
Admission: EM | Admit: 2024-04-05 | Discharge: 2024-04-05 | Disposition: A | Discharge status: Home / Self Care | Attending: Emergency Medicine | Admitting: Emergency Medicine

## 2024-04-05 ENCOUNTER — Encounter (HOSPITAL_BASED_OUTPATIENT_CLINIC_OR_DEPARTMENT_OTHER): Payer: Self-pay

## 2024-04-05 ENCOUNTER — Other Ambulatory Visit (HOSPITAL_BASED_OUTPATIENT_CLINIC_OR_DEPARTMENT_OTHER): Payer: Self-pay

## 2024-04-05 ENCOUNTER — Other Ambulatory Visit: Payer: Self-pay

## 2024-04-05 ENCOUNTER — Emergency Department (HOSPITAL_BASED_OUTPATIENT_CLINIC_OR_DEPARTMENT_OTHER)

## 2024-04-05 DIAGNOSIS — J40 Bronchitis, not specified as acute or chronic: Secondary | ICD-10-CM | POA: Diagnosis not present

## 2024-04-05 DIAGNOSIS — R0602 Shortness of breath: Secondary | ICD-10-CM | POA: Diagnosis not present

## 2024-04-05 DIAGNOSIS — R058 Other specified cough: Secondary | ICD-10-CM | POA: Diagnosis present

## 2024-04-05 DIAGNOSIS — Z7982 Long term (current) use of aspirin: Secondary | ICD-10-CM | POA: Diagnosis not present

## 2024-04-05 LAB — CBC WITH DIFFERENTIAL/PLATELET
Abs Immature Granulocytes: 0 10*3/uL (ref 0.00–0.07)
Basophils Absolute: 0.1 10*3/uL (ref 0.0–0.1)
Basophils Relative: 1 %
Eosinophils Absolute: 0.2 10*3/uL (ref 0.0–0.5)
Eosinophils Relative: 4 %
HCT: 38.7 % (ref 36.0–46.0)
Hemoglobin: 12.7 g/dL (ref 12.0–15.0)
Immature Granulocytes: 0 %
Lymphocytes Relative: 27 %
Lymphs Abs: 1.3 10*3/uL (ref 0.7–4.0)
MCH: 28.9 pg (ref 26.0–34.0)
MCHC: 32.8 g/dL (ref 30.0–36.0)
MCV: 88.2 fL (ref 80.0–100.0)
Monocytes Absolute: 0.7 10*3/uL (ref 0.1–1.0)
Monocytes Relative: 14 %
Neutro Abs: 2.7 10*3/uL (ref 1.7–7.7)
Neutrophils Relative %: 54 %
Platelets: 220 10*3/uL (ref 150–400)
RBC: 4.39 MIL/uL (ref 3.87–5.11)
RDW: 13.5 % (ref 11.5–15.5)
WBC: 4.9 10*3/uL (ref 4.0–10.5)
nRBC: 0 % (ref 0.0–0.2)

## 2024-04-05 LAB — BASIC METABOLIC PANEL WITH GFR
Anion gap: 8 (ref 5–15)
BUN: 16 mg/dL (ref 8–23)
CO2: 24 mmol/L (ref 22–32)
Calcium: 9 mg/dL (ref 8.9–10.3)
Chloride: 104 mmol/L (ref 98–111)
Creatinine, Ser: 1.04 mg/dL — ABNORMAL HIGH (ref 0.44–1.00)
GFR, Estimated: 56 mL/min — ABNORMAL LOW (ref >60)
Glucose, Bld: 130 mg/dL — ABNORMAL HIGH (ref 70–99)
Potassium: 4 mmol/L (ref 3.5–5.1)
Sodium: 136 mmol/L (ref 135–145)

## 2024-04-05 LAB — RESP PANEL BY RT-PCR (RSV, FLU A&B, COVID)  RVPGX2
Influenza A by PCR: NEGATIVE
Influenza B by PCR: NEGATIVE
Resp Syncytial Virus by PCR: NEGATIVE
SARS Coronavirus 2 by RT PCR: NEGATIVE

## 2024-04-05 LAB — BRAIN NATRIURETIC PEPTIDE: B Natriuretic Peptide: 59.6 pg/mL (ref 0.0–100.0)

## 2024-04-05 MED ORDER — AZITHROMYCIN 250 MG PO TABS
250.0000 mg | ORAL_TABLET | Freq: Every day | ORAL | 0 refills | Status: DC
  Filled 2024-04-05: qty 6, 5d supply, fill #0

## 2024-04-05 NOTE — ED Triage Notes (Signed)
 Cough, congestion, fevers since Monday. States gets [facility] w exertion, denies chest pain

## 2024-04-05 NOTE — ED Provider Notes (Signed)
 [facility] Provider Note   CSN: [id number] Arrival date & time: 04/05/24  0749     History  Chief Complaint  Patient presents with   URI    Whitney Hess is a 73 y.o. female.  She is here with complaint of cough productive of some sputum, unknown color, subjective fever, nasal congestion sinus pressure.  Having difficulty lying down flat and needed to sleep in recliner last night.  Persistent cough.  No sick contacts or recent travel.  She said she feels like she might have bronchitis or pneumonia.  The history is provided by the patient.  URI Presenting symptoms: congestion, cough, fatigue, fever and rhinorrhea   Cough:    Cough characteristics:  Productive   Sputum characteristics:  Unable to specify   Severity:  Moderate   Onset quality:  Gradual   Timing:  Intermittent   Progression:  Worsening   Chronicity:  New Associated symptoms: sinus pain   Risk factors: no immunosuppression, no recent travel and no sick contacts        Home Medications Prior to Admission medications   Medication Sig Start Date End Date Taking? Authorizing Provider  Alpha-Lipoic Acid 300 MG CAPS Take by mouth.    [provider]  ascorbic acid (VITAMIN C) 500 MG tablet Take by mouth.    [provider]  aspirin 325 MG tablet Take 325 mg by mouth daily.    [provider]  Biotin 1 MG CAPS Take by mouth.    [provider]  Biotin 1000 MCG tablet Take by mouth.    [provider]  calcium carbonate (OSCAL) 1500 (600 Ca) MG TABS tablet Take by mouth 2 (two) times daily with a meal.    [provider]  cephALEXin (KEFLEX) 500 MG capsule Take 1 capsule (500 mg total) by mouth 3 (three) times daily. 07/07/22   [place], Madison A, PA-C  colchicine 0.6 MG tablet Take 0.6 mg by mouth daily.    [provider]  Flaxseed, Linseed, (FLAXSEED OIL PO) Take by mouth.    [provider]   furosemide (LASIX) 40 MG tablet Take 40 mg by mouth.    [provider]  L-Lysine HCl 1000 MG TABS Take by mouth.    [provider]  loratadine (CLARITIN) 10 MG tablet Take 10 mg by mouth daily.    [provider]  losartan (COZAAR) 100 MG tablet Take 100 mg by mouth daily.    [provider]  Magnesium 250 MG TABS Take by mouth.    [provider]  metoprolol succinate (TOPROL-XL) 50 MG 24 hr tablet Take 50 mg by mouth daily. Take with or immediately following a meal.    [provider]  potassium chloride SA (KLOR-CON) 20 MEQ tablet Take 20 mEq by mouth 2 (two) times daily.    [provider]  pravastatin (PRAVACHOL) 20 MG tablet Take 20 mg by mouth daily.    [provider]  Selenium 200 MCG CAPS Take by mouth.    [provider]  vitamin C (ASCORBIC ACID) 500 MG tablet Take 500 mg by mouth daily.    [provider]  Zinc 50 MG CAPS Take by mouth.    [provider]  zinc gluconate 50 MG tablet Take by mouth.    [provider]      Allergies    Codeine, Amlodipine besylate, Sulfamethoxazole-trimethoprim, and Tramadol    Review of  Systems   Review of Systems  Constitutional:  Positive for fatigue and fever.  HENT:  Positive for congestion, rhinorrhea and sinus pain.   Respiratory:  Positive for cough.   Cardiovascular:  Negative for chest pain.  Gastrointestinal:  Negative for abdominal pain.    Physical Exam Updated Vital Signs BP 138/77   Pulse 70   Temp 98.4 F (36.9 C) (Oral)   Resp 20   Ht 5\' 7"  (1.702 m)   Wt 94.8 kg   SpO2 99%   BMI 32.73 kg/m  Physical Exam Vitals and nursing note reviewed.  Constitutional:      General: She is not in acute distress.    Appearance: Normal appearance. She is well-developed.  HENT:     Head: Normocephalic and atraumatic.     Nose: Rhinorrhea present.  Eyes:     Conjunctiva/sclera: Conjunctivae normal.  Cardiovascular:      Rate and Rhythm: Normal rate and regular rhythm.     Heart sounds: No murmur heard. Pulmonary:     Effort: Pulmonary effort is normal. No respiratory distress.     Breath sounds: Rhonchi (r base) present.  Abdominal:     Palpations: Abdomen is soft.     Tenderness: There is no abdominal tenderness.  Musculoskeletal:        General: No deformity.     Cervical back: Neck supple.  Skin:    General: Skin is warm and dry.     Capillary Refill: Capillary refill takes less than 2 seconds.  Neurological:     General: No focal deficit present.     Mental Status: She is alert.     ED Results / Procedures / Treatments   Labs (all labs ordered are listed, but only abnormal results are displayed) Labs Reviewed  BASIC METABOLIC PANEL WITH GFR - Abnormal; Notable for the following components:      Result Value   Glucose, Bld 130 (*)    Creatinine, Ser 1.04 (*)    GFR, Estimated 56 (*)    All other components within normal limits  RESP PANEL BY RT-PCR (RSV, FLU A&B, COVID)  [id number]  CBC WITH DIFFERENTIAL/PLATELET  BRAIN NATRIURETIC PEPTIDE    EKG None  Radiology DG Chest 2 View Result Date: 04/05/2024 CLINICAL DATA:  Cough.  Shortness of breath. EXAM: CHEST - 2 VIEW COMPARISON:  08/06/2020. FINDINGS: Bilateral lung fields are clear. Bilateral costophrenic angles are clear. Normal cardio-mediastinal silhouette. No acute osseous abnormalities. Lower cervical and upper lumbar spinal fixation hardware noted. The soft tissues are within normal limits. IMPRESSION: No active cardiopulmonary disease. Electronically Signed   By: Jules Schick M.D.   On: 04/05/2024 09:03    Procedures Procedures    Medications Ordered in ED Medications - No data to display  ED Course/ Medical Decision Making/ A&P Clinical Course as of 04/05/24 1704  Wed Apr 05, 2024  0902 Chest x-ray interpreted by me as no acute infiltrate.  Lab work unremarkable including BNP COVID and flu.  Will treat with  antibiotics for possible bronchitis. [MB]  [id number] Patient offered inhaler, she is declining this.  Recommended she continue her allergy medication [MB]    Clinical Course User Index [MB] Terrilee Files, MD                                 Medical Decision Making Amount and/or Complexity of Data Reviewed Labs: ordered. Radiology: ordered.  Risk  Prescription drug management.   This patient complains of cough and chest congestion fever shortness of breath; this involves an extensive number of treatment Options and is a complaint that carries with it a high risk of complications and morbidity. The differential includes pneumonia, COVID, flu, bronchitis, CHF  I ordered, reviewed and interpreted labs, which included COVID and flu negative, CBC with normal white count, chemistries unremarkable, BNP normal I ordered imaging studies which included chest x-ray and I independently    visualized and interpreted imaging which showed no acute findings Previous records obtained and reviewed in epic including recent PCP visits Cardiac monitoring reviewed, normal sinus rhythm Social determinants considered, no significant barriers Critical Interventions: None  After the interventions stated above, I reevaluated the patient and found patient to be satting well on room air in no distress Admission and further testing considered, no indications for admission or further workup at this time.  Will cover her with antibiotics for possible bronchitis.  Recommended close follow-up with PCP.  She is declining an inhaler.  Return instructions discussed         Final Clinical Impression(s) / ED Diagnoses Final diagnoses:  Bronchitis    Rx / DC Orders ED Discharge Orders          Ordered    azithromycin (ZITHROMAX) 250 MG tablet  Daily        04/05/24 0910              Terrilee Files, MD 04/05/24 [phone]

## 2024-06-13 NOTE — Patient Instructions (Signed)
 SURGICAL WAITING ROOM VISITATION  Patients having surgery or a procedure may have no more than 2 support people in the waiting area - these visitors may rotate.    Children under the age of 71 must have an adult with them who is not the patient.  Visitors with respiratory illnesses are discouraged from visiting and should remain at home.  If the patient needs to stay at the hospital during part of their recovery, the visitor guidelines for inpatient rooms apply. Pre-op nurse will coordinate an appropriate time for 1 support person to accompany patient in pre-op.  This support person may not rotate.    Please refer to the [facility] website for the visitor guidelines for Inpatients (after your surgery is over and you are in a regular room).       Your procedure is scheduled on: 06/27/24   Report to [facility] Main Entrance    Report to admitting at : 7:30 AM   Call this number if you have problems the morning of surgery [phone]   Do not eat food :After Midnight.   After Midnight you may have the following liquids until : 7:00 AM DAY OF SURGERY  Water Non-Citrus Juices (without pulp, NO RED-Apple, White grape, White cranberry) Black Coffee (NO MILK/CREAM OR CREAMERS, sugar ok)  Clear Tea (NO MILK/CREAM OR CREAMERS, sugar ok) regular and decaf                             Plain Jell-O (NO RED)                                           Fruit ices (not with fruit pulp, NO RED)                                     Popsicles (NO RED)                                                               Sports drinks like Gatorade (NO RED)   The day of surgery:  Drink ONE (1) Pre-Surgery Clear Ensure at A: 7:00 M the morning of surgery. Drink in one sitting. Do not sip.  This drink was given to you during your hospital  pre-op appointment visit. Nothing else to drink after completing the  Pre-Surgery Clear Ensure or G2.          If you have questions, please contact your  surgeon's office.  FOLLOW ANY ADDITIONAL PRE OP INSTRUCTIONS YOU RECEIVED FROM YOUR SURGEON'S OFFICE!!!     Oral Hygiene is also important to reduce your risk of infection.                                    Remember - BRUSH YOUR TEETH THE MORNING OF SURGERY WITH YOUR REGULAR TOOTHPASTE  DENTURES WILL BE REMOVED PRIOR TO SURGERY PLEASE DO NOT APPLY Poly grip OR ADHESIVES!!!   Do NOT smoke after Midnight  Stop all vitamins and herbal supplements 7 days before surgery.   Take these medicines the morning of surgery with A SIP OF WATER: cetirizine,metoprolol,colchicine,pantoprazole.  DO NOT TAKE ANY ORAL DIABETIC MEDICATIONS DAY OF YOUR SURGERY                              You may not have any metal on your body including hair pins, jewelry, and body piercing             Do not wear make-up, lotions, powders, perfumes/cologne, or deodorant  Do not wear nail polish including gel and S&S, artificial/acrylic nails, or any other type of covering on natural nails including finger and toenails. If you have artificial nails, gel coating, etc. that needs to be removed by a nail salon please have this removed prior to surgery or surgery may need to be canceled/ delayed if the surgeon/ anesthesia feels like they are unable to be safely monitored.   Do not shave  48 hours prior to surgery.    Do not bring valuables to the hospital. [facility] IS NOT             RESPONSIBLE   FOR VALUABLES.   Contacts, glasses, dentures or bridgework may not be worn into surgery.   Bring small overnight bag day of surgery.   DO NOT BRING YOUR HOME MEDICATIONS TO THE HOSPITAL. PHARMACY WILL DISPENSE MEDICATIONS LISTED ON YOUR MEDICATION LIST TO YOU DURING YOUR ADMISSION IN THE HOSPITAL!    Patients discharged on the day of surgery will not be allowed to drive home.  Someone NEEDS to stay with you for the first 24 hours after anesthesia.   Special Instructions: Bring a copy of your healthcare power of  attorney and living will documents the day of surgery if you haven't scanned them before.              Please read over the following fact sheets you were given: IF YOU HAVE QUESTIONS ABOUT YOUR PRE-OP INSTRUCTIONS PLEASE CALL [phone]   If you received a COVID test during your pre-op visit  it is requested that you wear a mask when out in public, stay away from anyone that may not be feeling well and notify your surgeon if you develop symptoms. If you test positive for Covid or have been in contact with anyone that has tested positive in the last 10 days please notify you surgeon.     Do NOT apply benzoyl peroxide gel on the day of surgery.  Pre-operative 5 CHG Bath Instructions   You can play a key role in reducing the risk of infection after surgery. Your skin needs to be as free of germs as possible. You can reduce the number of germs on your skin by washing with CHG (chlorhexidine gluconate) soap before surgery. CHG is an antiseptic soap that kills germs and continues to kill germs even after washing.   DO NOT use if you have an allergy to chlorhexidine/CHG or antibacterial soaps. If your skin becomes reddened or irritated, stop using the CHG and notify one of our RNs at [phone].   Please shower with the CHG soap starting 4 days before surgery using the following schedule:     Please keep in mind the following:  DO NOT shave, including legs and underarms, starting the day of your first shower.   You may shave your face at any point before/day of surgery.  Place clean sheets on your bed the day you start using CHG soap. Use a clean washcloth (not used since being washed) for each shower. DO NOT sleep with pets once you start using the CHG.   CHG Shower Instructions:  If you choose to wash your hair and private area, wash first with your normal shampoo/soap.  After you use shampoo/soap, rinse your hair and body thoroughly to remove shampoo/soap residue.  Turn the water OFF  and apply about 3 tablespoons (45 ml) of CHG soap to a CLEAN washcloth.  Apply CHG soap ONLY FROM YOUR NECK DOWN TO YOUR TOES (washing for 3-5 minutes)  DO NOT use CHG soap on face, private areas, open wounds, or sores.  Pay special attention to the area where your surgery is being performed.  If you are having back surgery, having someone wash your back for you may be helpful. Wait 2 minutes after CHG soap is applied, then you may rinse off the CHG soap.  Pat dry with a clean towel  Put on clean clothes/pajamas   If you choose to wear lotion, please use ONLY the CHG-compatible lotions on the back of this paper.     Additional instructions for the day of surgery: DO NOT APPLY any lotions, deodorants, cologne, or perfumes.   Put on clean/comfortable clothes.  Brush your teeth.  Ask your nurse before applying any prescription medications to the skin.   CHG Compatible Lotions   Aveeno Moisturizing lotion  Cetaphil Moisturizing Cream  Cetaphil Moisturizing Lotion  Clairol Herbal Essence Moisturizing Lotion, Dry Skin  Clairol Herbal Essence Moisturizing Lotion, Extra Dry Skin  Clairol Herbal Essence Moisturizing Lotion, Normal Skin  Curel Age Defying Therapeutic Moisturizing Lotion with Alpha Hydroxy  Curel Extreme Care Body Lotion  Curel Soothing Hands Moisturizing Hand Lotion  Curel Therapeutic Moisturizing Cream, Fragrance-Free  Curel Therapeutic Moisturizing Lotion, Fragrance-Free  Curel Therapeutic Moisturizing Lotion, Original Formula  Eucerin Daily Replenishing Lotion  Eucerin Dry Skin Therapy Plus Alpha Hydroxy Crme  Eucerin Dry Skin Therapy Plus Alpha Hydroxy Lotion  Eucerin Original Crme  Eucerin Original Lotion  Eucerin Plus Crme Eucerin Plus Lotion  Eucerin TriLipid Replenishing Lotion  Keri Anti-Bacterial Hand Lotion  Keri Deep Conditioning Original Lotion Dry Skin Formula Softly Scented  Keri Deep Conditioning Original Lotion, Fragrance Free Sensitive Skin  Formula  Keri Lotion Fast Absorbing Fragrance Free Sensitive Skin Formula  Keri Lotion Fast Absorbing Softly Scented Dry Skin Formula  Keri Original Lotion  Keri Skin Renewal Lotion Keri Silky Smooth Lotion  Keri Silky Smooth Sensitive Skin Lotion  Nivea Body Creamy Conditioning Oil  Nivea Body Extra Enriched Lotion  Nivea Body Original Lotion  Nivea Body Sheer Moisturizing Lotion Nivea Crme  Nivea Skin Firming Lotion  NutraDerm 30 Skin Lotion  NutraDerm Skin Lotion  NutraDerm Therapeutic Skin Cream  NutraDerm Therapeutic Skin Lotion  ProShield Protective Hand Cream  Provon moisturizing lotion   Incentive Spirometer  An incentive spirometer is a tool that can help keep your lungs clear and active. This tool measures how well you are filling your lungs with each breath. Taking long deep breaths may help reverse or decrease the chance of developing breathing (pulmonary) problems (especially infection) following: A long period of time when you are unable to move or be active. BEFORE THE PROCEDURE  If the spirometer includes an indicator to show your best effort, your nurse or respiratory therapist will set it to a desired goal. If possible, sit up straight or lean slightly forward. Try  not to slouch. Hold the incentive spirometer in an upright position. INSTRUCTIONS FOR USE  Sit on the edge of your bed if possible, or sit up as far as you can in bed or on a chair. Hold the incentive spirometer in an upright position. Breathe out normally. Place the mouthpiece in your mouth and seal your lips tightly around it. Breathe in slowly and as deeply as possible, raising the piston or the ball toward the top of the column. Hold your breath for 3-5 seconds or for as long as possible. Allow the piston or ball to fall to the bottom of the column. Remove the mouthpiece from your mouth and breathe out normally. Rest for a few seconds and repeat Steps 1 through 7 at least 10 times every 1-2 hours  when you are awake. Take your time and take a few normal breaths between deep breaths. The spirometer may include an indicator to show your best effort. Use the indicator as a goal to work toward during each repetition. After each set of 10 deep breaths, practice coughing to be sure your lungs are clear. If you have an incision (the cut made at the time of surgery), support your incision when coughing by placing a pillow or rolled up towels firmly against it. Once you are able to get out of bed, walk around indoors and cough well. You may stop using the incentive spirometer when instructed by your caregiver.  RISKS AND COMPLICATIONS Take your time so you do not get dizzy or light-headed. If you are in pain, you may need to take or ask for pain medication before doing incentive spirometry. It is harder to take a deep breath if you are having pain. AFTER USE Rest and breathe slowly and easily. It can be helpful to keep track of a log of your progress. Your caregiver can provide you with a simple table to help with this. If you are using the spirometer at home, follow these instructions: SEEK MEDICAL CARE IF:  You are having difficultly using the spirometer. You have trouble using the spirometer as often as instructed. Your pain medication is not giving enough relief while using the spirometer. You develop fever of 100.5 F (38.1 C) or higher. SEEK IMMEDIATE MEDICAL CARE IF:  You cough up bloody sputum that had not been present before. You develop fever of 102 F (38.9 C) or greater. You develop worsening pain at or near the incision site. MAKE SURE YOU:  Understand these instructions. Will watch your condition. Will get help right away if you are not doing well or get worse. Document Released: 04/26/2007 Document Revised: 03/07/2012 Document Reviewed: 06/27/2007 [facility] Patient Information 2014 [place], [place].   ________________________________________________________________________

## 2024-06-14 ENCOUNTER — Other Ambulatory Visit: Payer: Self-pay

## 2024-06-14 ENCOUNTER — Encounter (HOSPITAL_COMMUNITY): Payer: Self-pay

## 2024-06-14 ENCOUNTER — Encounter (HOSPITAL_COMMUNITY)
Admission: RE | Admit: 2024-06-14 | Discharge: 2024-06-14 | Disposition: A | Discharge status: Home / Self Care | Source: Ambulatory Visit | Attending: Orthopedic Surgery | Admitting: Orthopedic Surgery

## 2024-06-14 VITALS — BP 124/75 | HR 62 | Temp 98.2°F | Ht 69.5 in | Wt 213.0 lb

## 2024-06-14 DIAGNOSIS — Z01818 Encounter for other preprocedural examination: Secondary | ICD-10-CM | POA: Diagnosis present

## 2024-06-14 DIAGNOSIS — I10 Essential (primary) hypertension: Secondary | ICD-10-CM | POA: Insufficient documentation

## 2024-06-14 HISTORY — DX: Cardiac murmur, unspecified: R01.1

## 2024-06-14 HISTORY — DX: Unspecified osteoarthritis, unspecified site: M19.90

## 2024-06-14 HISTORY — DX: Chronic kidney disease, unspecified: N18.9

## 2024-06-14 LAB — BASIC METABOLIC PANEL WITH GFR
Anion gap: 7 (ref 5–15)
BUN: 19 mg/dL (ref 8–23)
CO2: 26 mmol/L (ref 22–32)
Calcium: 9.1 mg/dL (ref 8.9–10.3)
Chloride: 105 mmol/L (ref 98–111)
Creatinine, Ser: 1.29 mg/dL — ABNORMAL HIGH (ref 0.44–1.00)
GFR, Estimated: 44 mL/min — ABNORMAL LOW (ref >60)
Glucose, Bld: 99 mg/dL (ref 70–99)
Potassium: 3.9 mmol/L (ref 3.5–5.1)
Sodium: 138 mmol/L (ref 135–145)

## 2024-06-14 LAB — CBC
HCT: 40.2 % (ref 36.0–46.0)
Hemoglobin: 12.8 g/dL (ref 12.0–15.0)
MCH: 28.4 pg (ref 26.0–34.0)
MCHC: 31.8 g/dL (ref 30.0–36.0)
MCV: 89.1 fL (ref 80.0–100.0)
Platelets: 237 10*3/uL (ref 150–400)
RBC: 4.51 MIL/uL (ref 3.87–5.11)
RDW: 13.7 % (ref 11.5–15.5)
WBC: 5.9 10*3/uL (ref 4.0–10.5)
nRBC: 0 % (ref 0.0–0.2)

## 2024-06-14 LAB — SURGICAL PCR SCREEN
MRSA, PCR: NEGATIVE
Staphylococcus aureus: NEGATIVE

## 2024-06-14 NOTE — Progress Notes (Signed)
 For Anesthesia: PCP - Lieutenant Reese., MD LOV: 01/26/24 Cardiologist - N/A  Bowel Prep reminder:  Chest x-ray -  EKG - 06/14/24 Stress Test -  ECHO - 07/29/20: [facility] Cardiac Cath -  Pacemaker/ICD device last checked: Pacemaker orders received: Device Rep notified:  Spinal Cord Stimulator: N/A  Sleep Study - N/A CPAP -   Fasting Blood Sugar - N/A Checks Blood Sugar _____ times a day Date and result of last Hgb A1c-  Last dose of GLP1 agonist- N/A GLP1 instructions:   Last dose of SGLT-2 inhibitors- N/A SGLT-2 instructions:   Blood Thinner Instructions:Aspirin 325 mg. Will be on hold 5 days before surgery. Aspirin Instructions: Last Dose:  Activity level:   Unable to go up a flight of stairs due to knee pain.    Anesthesia review: Hx: HTN,TIA,CKD 3.  Patient denies shortness of breath, fever, cough and chest pain at PAT appointment   Patient verbalized understanding of instructions that were reviewed over the telephone.

## 2024-06-15 NOTE — H&P (Cosign Needed)
 TOTAL KNEE H&P  Patient is being admitted for left total knee arthroplasty.  Therapy Plans: outpatient therapy at [facility] Disposition: Home with husband Planned DVT Prophylaxis: aspirin 81mg  BID DME needed: walker PCP: Dr. Hobert Lull - clearance received TXA: IV Allergies: bactrim - abdominal pain/itching, celebrex - hair loss, codeine - vomiting, Anesthesia Concerns: significant cervical and lumbar fusion ?? general BMI: 33.4 Last HgbA1c: not diabetic   Other: - Planning on SDD - did this with her lumbar fusion - hx of CKD - avoid NSAIDs - urinary incontinence - Patient is a retired TEFL teacher at [employer] - hydrocodone, robaxin , tylenol, meloxicam - hx of benign lung nodules  Subjective:  Chief Complaint:left knee pain.  HPI: Whitney Hess, 73 y.o. female, has a history of pain and functional disability in the left knee due to arthritis and has failed non-surgical conservative treatments for greater than 12 weeks to includeNSAID's and/or analgesics, corticosteriod injections, and activity modification.  Onset of symptoms was gradual, starting 2 years ago with gradually worsening course since that time. The patient noted no past surgery on the left knee(s).  Patient currently rates pain in the left knee(s) at 8 out of 10 with activity. Patient has worsening of pain with activity and weight bearing, pain that interferes with activities of daily living, and pain with passive range of motion.  Patient has evidence of joint space narrowing by imaging studies. There is no active infection.  There are no active problems to display for this patient.  Past Medical History:  Diagnosis Date   Arthritis    Chronic kidney disease    stage 3   Heart murmur    Hypertension    Stroke [facility])     Past Surgical History:  Procedure Laterality Date   ABDOMINAL HYSTERECTOMY     ANTERIOR CERVICAL DECOMP/DISCECTOMY FUSION     ANTERIOR CRUCIATE LIGAMENT REPAIR Left    ARTHROPLASTY  Bilateral    second toe   BACK SURGERY     BRAIN SURGERY     BUNIONECTOMY Bilateral     No current facility-administered medications for this encounter.   Current Outpatient Medications  Medication Sig Dispense Refill Last Dose/Taking   ALPHA-LIPOIC ACID PO Take 2 capsules by mouth in the morning.   Taking   ASCORBIC ACID PO Take 1 tablet by mouth in the morning and at bedtime.   Taking   aspirin 325 MG tablet Take 325 mg by mouth in the morning.   Taking   BIOTIN PO Take 1 tablet by mouth every evening.   Taking   Calcium Carb-Cholecalciferol (OYSTER SHELL CALCIUM + D3 PO) Take 1 tablet by mouth in the morning and at bedtime.   Taking   cetirizine (ZYRTEC) 10 MG tablet Take 10 mg by mouth in the morning.   Taking   colchicine 0.6 MG tablet Take 0.6 mg by mouth in the morning.   Taking   furosemide (LASIX) 40 MG tablet Take 40 mg by mouth daily in the afternoon.   Taking   losartan (COZAAR) 50 MG tablet Take 100 mg by mouth in the morning.   Taking   MAGNESIUM PO Take 1 tablet by mouth in the morning and at bedtime.   Taking   metoprolol succinate (TOPROL-XL) 50 MG 24 hr tablet Take 100 mg by mouth in the morning. Take with or immediately following a meal.   Taking   Multiple Vitamins-Minerals (ZINC PO) Take 1 capsule by mouth every evening.   Taking  NIACIN PO Take 1 tablet by mouth every evening.   Taking   pantoprazole (PROTONIX) 40 MG tablet Take 40 mg by mouth in the morning and at bedtime.   Taking   potassium chloride SA (KLOR-CON) 20 MEQ tablet Take 20 mEq by mouth daily in the afternoon.   Taking   pravastatin (PRAVACHOL) 20 MG tablet Take 20 mg by mouth at bedtime.   Taking   SELENIUM PO Take 1 capsule by mouth every evening.   Taking   VITAMIN E PO Take 1 capsule by mouth in the morning.   Taking   Allergies  Allergen Reactions   Codeine Nausea And Vomiting and Swelling    AIRWAY SWELLING,VOMITING    Amlodipine Besylate Hypertension   Celecoxib Other (See Comments)     HAIR LOSS   Sulfamethoxazole-Trimethoprim Itching   Tramadol Itching    Social History   Tobacco Use   Smoking status: Never   Smokeless tobacco: Never  Substance Use Topics   Alcohol use: Never    No family history on file.   Review of Systems  Constitutional:  Negative for chills and fever.  Respiratory:  Negative for cough and shortness of breath.   Cardiovascular:  Negative for chest pain.  Gastrointestinal:  Negative for nausea and vomiting.  Musculoskeletal:  Positive for arthralgias.     Objective:  Physical Exam Well nourished and well developed. General: Alert and oriented x3, cooperative and pleasant, no acute distress.  Musculoskeletal: Left knee exam: No palpable effusion, warmth erythema Slight genu varum No significant flexion contracture Flexion over 110 degrees with some tightness and crepitation over the anterior medial aspect the knee Stable medial and lateral collateral ligaments Right knee exam: Mild tenderness medially Full knee extension and flexion to 120 degrees  Vital signs in last 24 hours: Temp:  [98.2 F (36.8 C)] 98.2 F (36.8 C) (06/18 1340) Pulse Rate:  [62] 62 (06/18 1340) BP: (124)/(75) 124/75 (06/18 1340) SpO2:  [99 %] 99 % (06/18 1340) Weight:  [96.6 kg] 96.6 kg (06/18 1340)  Labs:   Estimated body mass index is 31 kg/m as calculated from the following:   Height as of 06/14/24: 5' 9.5 (1.765 m).   Weight as of 06/14/24: 96.6 kg.   Imaging Review Plain radiographs demonstrate severe degenerative joint disease of the left knee(s). The overall alignment isneutral. The bone quality appears to be adequate for age and reported activity level.      Assessment/Plan:  End stage arthritis, left knee   The patient history, physical examination, clinical judgment of the provider and imaging studies are consistent with end stage degenerative joint disease of the left knee(s) and total knee arthroplasty is deemed medically  necessary. The treatment options including medical management, injection therapy arthroscopy and arthroplasty were discussed at length. The risks and benefits of total knee arthroplasty were presented and reviewed. The risks due to aseptic loosening, infection, stiffness, patella tracking problems, thromboembolic complications and other imponderables were discussed. The patient acknowledged the explanation, agreed to proceed with the plan and consent was signed. Patient is being admitted for inpatient treatment for surgery, pain control, PT, OT, prophylactic antibiotics, VTE prophylaxis, progressive ambulation and ADL's and discharge planning. The patient is planning to be discharged home.     Patient's anticipated LOS is less than 2 midnights, meeting these requirements: - Younger than 26 - Lives within 1 hour of care - Has a competent adult at home to recover with post-op recover - NO history of  -  Chronic pain requiring opiods  - Diabetes  - Coronary Artery Disease  - Heart failure  - Heart attack  - Stroke  - DVT/VTE  - Cardiac arrhythmia  - Respiratory Failure/COPD  - Renal failure  - Anemia  - Advanced Liver disease  Kim Pen, PA-C Orthopedic Surgery [facility] [place] [phone]

## 2024-06-20 NOTE — Progress Notes (Signed)
 Anesthesia Chart Review   Case: [id number] Date/Time: 06/27/24 0955   Procedure: ARTHROPLASTY, KNEE, TOTAL (Left: Knee)   Anesthesia type: Spinal   Diagnosis: Primary osteoarthritis of left knee [M17.12]   Pre-op diagnosis: Left Knee Osteoarthritis   Location: [facility] ROOM 09 / [facility]   Surgeons: Ernie Cough, MD       DISCUSSION:73 y.o. never smoker with h/o HTN, CKD Stage III, stroke, left knee OA scheduled for above procedure 06/27/2024 with Dr. Cough Ernie.   Per PCP notes no murmur heard.  Echo 2020 with no valvular problems.   Clearance from PCP received which states pt is cleared from medical and cardiac standpoint. Per most recent notes she denies exertional dyspnea or chest pain.  Echo 07/05/2019 SUMMARY  The left ventricular size is normal.   Left ventricular systolic function is normal.  LV ejection fraction = 55-60%.   Left ventricular filling pattern is prolonged relaxation.  The right ventricle is mildly dilated.  The right ventricular systolic function is normal.  The right atrium is mildly dilated.  No significant stenosis or regurgitation seen  There was insufficient TR detected to calculate RV systolic pressure.  Estimated right atrial pressure is 5 mmHg.SABRA  There is no pericardial effusion.  There is no comparison study available.  VS: BP 124/75   Pulse 62   Temp 36.8 C (Oral)   Ht 5' 9.5 (1.765 m)   Wt 96.6 kg   SpO2 99%   BMI 31.00 kg/m   PROVIDERS: Andrew Truman GRADE., MD is PCP    LABS: Labs reviewed: Acceptable for surgery. (all labs ordered are listed, but only abnormal results are displayed)  Labs Reviewed  BASIC METABOLIC PANEL WITH GFR - Abnormal; Notable for the following components:      Result Value   Creatinine, Ser 1.29 (*)    GFR, Estimated 44 (*)    All other components within normal limits  SURGICAL PCR SCREEN  CBC     IMAGES:   EKG:   CV:  Past Medical History:  Diagnosis Date   Arthritis    Chronic kidney  disease    stage 3   Heart murmur    Hypertension    Stroke [facility])     Past Surgical History:  Procedure Laterality Date   ABDOMINAL HYSTERECTOMY     ANTERIOR CERVICAL DECOMP/DISCECTOMY FUSION     ANTERIOR CRUCIATE LIGAMENT REPAIR Left    ARTHROPLASTY Bilateral    second toe   BACK SURGERY     BRAIN SURGERY     BUNIONECTOMY Bilateral     MEDICATIONS:  ALPHA-LIPOIC ACID PO   ASCORBIC ACID PO   aspirin 325 MG tablet   BIOTIN PO   Calcium Carb-Cholecalciferol (OYSTER SHELL CALCIUM + D3 PO)   cetirizine (ZYRTEC) 10 MG tablet   colchicine 0.6 MG tablet   furosemide (LASIX) 40 MG tablet   losartan (COZAAR) 50 MG tablet   MAGNESIUM PO   metoprolol succinate (TOPROL-XL) 50 MG 24 hr tablet   Multiple Vitamins-Minerals (ZINC PO)   NIACIN PO   pantoprazole (PROTONIX) 40 MG tablet   potassium chloride SA (KLOR-CON) 20 MEQ tablet   pravastatin (PRAVACHOL) 20 MG tablet   SELENIUM PO   VITAMIN E PO   No current facility-administered medications for this encounter.    Whitney Hess Ward, PA-C [facility] [phone]

## 2024-06-27 ENCOUNTER — Observation Stay (HOSPITAL_COMMUNITY)
Admission: RE | Admit: 2024-06-27 | Discharge: 2024-06-28 | Disposition: A | Discharge status: Home / Self Care | Source: Ambulatory Visit | Attending: Orthopedic Surgery | Admitting: Orthopedic Surgery

## 2024-06-27 ENCOUNTER — Encounter (HOSPITAL_COMMUNITY): Payer: Self-pay | Admitting: Orthopedic Surgery

## 2024-06-27 ENCOUNTER — Other Ambulatory Visit: Payer: Self-pay

## 2024-06-27 ENCOUNTER — Encounter (HOSPITAL_COMMUNITY)
Admission: RE | Disposition: A | Discharge status: Home / Self Care | Payer: Self-pay | Source: Ambulatory Visit | Attending: Orthopedic Surgery

## 2024-06-27 ENCOUNTER — Ambulatory Visit (HOSPITAL_COMMUNITY): Admitting: Certified Registered"

## 2024-06-27 ENCOUNTER — Ambulatory Visit (HOSPITAL_COMMUNITY): Payer: Self-pay | Admitting: Physician Assistant

## 2024-06-27 DIAGNOSIS — N183 Chronic kidney disease, stage 3 unspecified: Secondary | ICD-10-CM | POA: Diagnosis not present

## 2024-06-27 DIAGNOSIS — Z96652 Presence of left artificial knee joint: Secondary | ICD-10-CM | POA: Diagnosis not present

## 2024-06-27 DIAGNOSIS — Z7901 Long term (current) use of anticoagulants: Secondary | ICD-10-CM | POA: Diagnosis not present

## 2024-06-27 DIAGNOSIS — I679 Cerebrovascular disease, unspecified: Secondary | ICD-10-CM

## 2024-06-27 DIAGNOSIS — M1712 Unilateral primary osteoarthritis, left knee: Secondary | ICD-10-CM

## 2024-06-27 DIAGNOSIS — I129 Hypertensive chronic kidney disease with stage 1 through stage 4 chronic kidney disease, or unspecified chronic kidney disease: Secondary | ICD-10-CM

## 2024-06-27 DIAGNOSIS — N189 Chronic kidney disease, unspecified: Secondary | ICD-10-CM

## 2024-06-27 DIAGNOSIS — M25562 Pain in left knee: Secondary | ICD-10-CM | POA: Diagnosis present

## 2024-06-27 HISTORY — PX: TOTAL KNEE ARTHROPLASTY: SHX125

## 2024-06-27 SURGERY — ARTHROPLASTY, KNEE, TOTAL
Anesthesia: Monitor Anesthesia Care | Site: Knee | Laterality: Left

## 2024-06-27 MED ORDER — LACTATED RINGERS IV SOLN: INTRAVENOUS | Status: DC

## 2024-06-27 MED ORDER — HYDROCODONE-ACETAMINOPHEN 5-325 MG PO TABS: 1.0000 | ORAL_TABLET | ORAL | Status: DC | PRN

## 2024-06-27 MED ORDER — ACETAMINOPHEN 325 MG PO TABS
325.0000 mg | ORAL_TABLET | Freq: Four times a day (QID) | ORAL | Status: DC | PRN
  Administered 2024-06-27: 650 mg via ORAL

## 2024-06-27 MED ORDER — PHENOL 1.4 % MT LIQD: 1.0000 | OROMUCOSAL | Status: DC | PRN

## 2024-06-27 MED ORDER — ORAL CARE MOUTH RINSE: 15.0000 mL | Freq: Once | OROMUCOSAL | Status: AC

## 2024-06-27 MED ORDER — TRANEXAMIC ACID-NACL 1000-0.7 MG/100ML-% IV SOLN
1000.0000 mg | INTRAVENOUS | Status: AC
  Administered 2024-06-27: 1000 mg via INTRAVENOUS
  Filled 2024-06-27: qty 100

## 2024-06-27 MED ORDER — LOSARTAN POTASSIUM 50 MG PO TABS
100.0000 mg | ORAL_TABLET | Freq: Every day | ORAL | Status: DC
  Administered 2024-06-28: 100 mg via ORAL
  Filled 2024-06-27: qty 2

## 2024-06-27 MED ORDER — DEXAMETHASONE SODIUM PHOSPHATE 10 MG/ML IJ SOLN
10.0000 mg | Freq: Once | INTRAMUSCULAR | Status: AC
  Administered 2024-06-28: 10 mg via INTRAVENOUS
  Filled 2024-06-27: qty 1

## 2024-06-27 MED ORDER — PHENYLEPHRINE HCL-NACL 20-0.9 MG/250ML-% IV SOLN
INTRAVENOUS | Status: DC | PRN
  Administered 2024-06-27: 50 ug/min via INTRAVENOUS

## 2024-06-27 MED ORDER — PHENYLEPHRINE HCL (PRESSORS) 10 MG/ML IV SOLN
INTRAVENOUS | Status: AC
  Filled 2024-06-27: qty 1

## 2024-06-27 MED ORDER — BUPIVACAINE IN DEXTROSE 0.75-8.25 % IT SOLN
INTRATHECAL | Status: DC | PRN
  Administered 2024-06-27: 1.6 mL via INTRATHECAL

## 2024-06-27 MED ORDER — METHOCARBAMOL 1000 MG/10ML IJ SOLN: 500.0000 mg | Freq: Four times a day (QID) | INTRAMUSCULAR | Status: DC | PRN

## 2024-06-27 MED ORDER — ACETAMINOPHEN 10 MG/ML IV SOLN
1000.0000 mg | Freq: Once | INTRAVENOUS | Status: DC | PRN
Start: 2024-06-27 — End: 2024-06-27

## 2024-06-27 MED ORDER — CHLORHEXIDINE GLUCONATE 0.12 % MT SOLN
15.0000 mL | Freq: Once | OROMUCOSAL | Status: AC
  Administered 2024-06-27: 15 mL via OROMUCOSAL

## 2024-06-27 MED ORDER — FENTANYL CITRATE (PF) 100 MCG/2ML IJ SOLN
INTRAMUSCULAR | Status: AC
  Filled 2024-06-27: qty 2

## 2024-06-27 MED ORDER — ACETAMINOPHEN 325 MG PO TABS: 325.0000 mg | ORAL_TABLET | Freq: Four times a day (QID) | ORAL | Status: DC | PRN

## 2024-06-27 MED ORDER — LORATADINE 10 MG PO TABS
10.0000 mg | ORAL_TABLET | Freq: Every day | ORAL | Status: DC
  Administered 2024-06-28: 10 mg via ORAL
  Filled 2024-06-27: qty 1

## 2024-06-27 MED ORDER — MENTHOL 3 MG MT LOZG: 1.0000 | LOZENGE | OROMUCOSAL | Status: DC | PRN

## 2024-06-27 MED ORDER — ONDANSETRON HCL 4 MG/2ML IJ SOLN
INTRAMUSCULAR | Status: DC | PRN
  Administered 2024-06-27: 4 mg via INTRAVENOUS

## 2024-06-27 MED ORDER — POTASSIUM CHLORIDE CRYS ER 20 MEQ PO TBCR
20.0000 meq | EXTENDED_RELEASE_TABLET | Freq: Every day | ORAL | Status: DC
  Administered 2024-06-28: 20 meq via ORAL
  Filled 2024-06-27: qty 1

## 2024-06-27 MED ORDER — COLCHICINE 0.6 MG PO TABS
0.6000 mg | ORAL_TABLET | Freq: Every day | ORAL | Status: DC
  Administered 2024-06-28: 0.6 mg via ORAL
  Filled 2024-06-27: qty 1

## 2024-06-27 MED ORDER — FUROSEMIDE 40 MG PO TABS
40.0000 mg | ORAL_TABLET | Freq: Every day | ORAL | Status: DC
Start: 2024-06-28 — End: 2024-06-28

## 2024-06-27 MED ORDER — METOCLOPRAMIDE HCL 5 MG/ML IJ SOLN: 5.0000 mg | Freq: Three times a day (TID) | INTRAMUSCULAR | Status: DC | PRN

## 2024-06-27 MED ORDER — PANTOPRAZOLE SODIUM 40 MG PO TBEC
40.0000 mg | DELAYED_RELEASE_TABLET | Freq: Two times a day (BID) | ORAL | Status: DC
  Administered 2024-06-27 – 2024-06-28 (×2): 40 mg via ORAL
  Filled 2024-06-27 (×2): qty 1

## 2024-06-27 MED ORDER — POVIDONE-IODINE 10 % EX SWAB: 2.0000 | Freq: Once | CUTANEOUS | Status: DC

## 2024-06-27 MED ORDER — STERILE WATER FOR IRRIGATION IR SOLN
Status: DC | PRN
  Administered 2024-06-27: 1000 mL

## 2024-06-27 MED ORDER — SODIUM CHLORIDE (PF) 0.9 % IJ SOLN
INTRAMUSCULAR | Status: AC
  Filled 2024-06-27: qty 30

## 2024-06-27 MED ORDER — CEFAZOLIN SODIUM-DEXTROSE 2-4 GM/100ML-% IV SOLN
2.0000 g | INTRAVENOUS | Status: AC
  Administered 2024-06-27: 50 mg via INTRAVENOUS
  Administered 2024-06-27: 2 g via INTRAVENOUS
  Filled 2024-06-27: qty 100

## 2024-06-27 MED ORDER — ONDANSETRON HCL 4 MG PO TABS: 4.0000 mg | ORAL_TABLET | Freq: Four times a day (QID) | ORAL | Status: DC | PRN

## 2024-06-27 MED ORDER — FENTANYL CITRATE PF 50 MCG/ML IJ SOSY
PREFILLED_SYRINGE | INTRAMUSCULAR | Status: AC
  Filled 2024-06-27: qty 1

## 2024-06-27 MED ORDER — MIDAZOLAM HCL 2 MG/2ML IJ SOLN
INTRAMUSCULAR | Status: AC
Start: 2024-06-27 — End: 2024-06-27
  Filled 2024-06-27: qty 2

## 2024-06-27 MED ORDER — POLYETHYLENE GLYCOL 3350 17 G PO PACK
17.0000 g | PACK | Freq: Two times a day (BID) | ORAL | Status: DC
  Administered 2024-06-27 – 2024-06-28 (×2): 17 g via ORAL
  Filled 2024-06-27 (×2): qty 1

## 2024-06-27 MED ORDER — DEXAMETHASONE SODIUM PHOSPHATE 10 MG/ML IJ SOLN
INTRAMUSCULAR | Status: DC | PRN
  Administered 2024-06-27: 10 mg

## 2024-06-27 MED ORDER — ACETAMINOPHEN 325 MG PO TABS
ORAL_TABLET | ORAL | Status: AC
  Filled 2024-06-27: qty 2

## 2024-06-27 MED ORDER — METOCLOPRAMIDE HCL 5 MG PO TABS: 5.0000 mg | ORAL_TABLET | Freq: Three times a day (TID) | ORAL | Status: DC | PRN

## 2024-06-27 MED ORDER — SODIUM CHLORIDE 0.9 % IR SOLN
Status: DC | PRN
  Administered 2024-06-27: 1000 mL

## 2024-06-27 MED ORDER — ASPIRIN 81 MG PO CHEW
81.0000 mg | CHEWABLE_TABLET | Freq: Two times a day (BID) | ORAL | Status: DC
Start: 2024-06-27 — End: 2024-06-28
  Administered 2024-06-27 – 2024-06-28 (×2): 81 mg via ORAL
  Filled 2024-06-27 (×2): qty 1

## 2024-06-27 MED ORDER — METHOCARBAMOL 500 MG PO TABS
ORAL_TABLET | ORAL | Status: AC
  Filled 2024-06-27: qty 1

## 2024-06-27 MED ORDER — BUPIVACAINE-EPINEPHRINE (PF) 0.25% -1:200000 IJ SOLN
INTRAMUSCULAR | Status: DC | PRN
  Administered 2024-06-27: 30 mL

## 2024-06-27 MED ORDER — FENTANYL CITRATE PF 50 MCG/ML IJ SOSY
50.0000 ug | PREFILLED_SYRINGE | Freq: Once | INTRAMUSCULAR | Status: AC
  Administered 2024-06-27 (×2): 50 ug via INTRAVENOUS

## 2024-06-27 MED ORDER — PRAVASTATIN SODIUM 20 MG PO TABS
20.0000 mg | ORAL_TABLET | Freq: Every day | ORAL | Status: DC
  Administered 2024-06-27: 20 mg via ORAL
  Filled 2024-06-27: qty 1

## 2024-06-27 MED ORDER — HYDROMORPHONE HCL 1 MG/ML IJ SOLN: 0.5000 mg | INTRAMUSCULAR | Status: DC | PRN

## 2024-06-27 MED ORDER — ROPIVACAINE HCL 7.5 MG/ML IJ SOLN
INTRAMUSCULAR | Status: DC | PRN
Start: 2024-06-27 — End: 2024-06-27
  Administered 2024-06-27: 20 mL via PERINEURAL

## 2024-06-27 MED ORDER — FENTANYL CITRATE PF 50 MCG/ML IJ SOSY
25.0000 ug | PREFILLED_SYRINGE | INTRAMUSCULAR | Status: DC | PRN
  Administered 2024-06-27 (×3): 50 ug via INTRAVENOUS

## 2024-06-27 MED ORDER — TRANEXAMIC ACID-NACL 1000-0.7 MG/100ML-% IV SOLN: 1000.0000 mg | Freq: Once | INTRAVENOUS | Status: DC

## 2024-06-27 MED ORDER — MIDAZOLAM HCL 2 MG/2ML IJ SOLN: 1.0000 mg | Freq: Once | INTRAMUSCULAR | Status: DC

## 2024-06-27 MED ORDER — PROPOFOL 10 MG/ML IV BOLUS
INTRAVENOUS | Status: DC | PRN
  Administered 2024-06-27: 100 mg via INTRAVENOUS

## 2024-06-27 MED ORDER — SODIUM CHLORIDE 0.9% FLUSH
3.0000 mL | Freq: Two times a day (BID) | INTRAVENOUS | Status: DC
  Administered 2024-06-27: 3 mL via INTRAVENOUS
  Administered 2024-06-28: 10 mL via INTRAVENOUS

## 2024-06-27 MED ORDER — DIPHENHYDRAMINE HCL 12.5 MG/5ML PO ELIX: 12.5000 mg | ORAL_SOLUTION | ORAL | Status: DC | PRN

## 2024-06-27 MED ORDER — BUPIVACAINE-EPINEPHRINE (PF) 0.25% -1:200000 IJ SOLN
INTRAMUSCULAR | Status: AC
  Filled 2024-06-27: qty 30

## 2024-06-27 MED ORDER — FENTANYL CITRATE PF 50 MCG/ML IJ SOSY
PREFILLED_SYRINGE | INTRAMUSCULAR | Status: AC
  Filled 2024-06-27: qty 2

## 2024-06-27 MED ORDER — HYDROCODONE-ACETAMINOPHEN 7.5-325 MG PO TABS
1.0000 | ORAL_TABLET | ORAL | Status: DC | PRN
  Administered 2024-06-27: 1 via ORAL
  Filled 2024-06-27: qty 1

## 2024-06-27 MED ORDER — SENNA 8.6 MG PO TABS
2.0000 | ORAL_TABLET | Freq: Every day | ORAL | Status: DC
  Administered 2024-06-27: 17.2 mg via ORAL
  Filled 2024-06-27: qty 2

## 2024-06-27 MED ORDER — CEFAZOLIN SODIUM-DEXTROSE 2-4 GM/100ML-% IV SOLN
2.0000 g | Freq: Four times a day (QID) | INTRAVENOUS | Status: AC
  Administered 2024-06-27: 2 g via INTRAVENOUS
  Filled 2024-06-27: qty 100

## 2024-06-27 MED ORDER — MIDAZOLAM HCL 2 MG/2ML IJ SOLN
INTRAMUSCULAR | Status: DC | PRN
  Administered 2024-06-27: 2 mg via INTRAVENOUS

## 2024-06-27 MED ORDER — SODIUM CHLORIDE (PF) 0.9 % IJ SOLN
INTRAMUSCULAR | Status: DC | PRN
Start: 2024-06-27 — End: 2024-06-27
  Administered 2024-06-27: 30 mL via INTRAVENOUS

## 2024-06-27 MED ORDER — ONDANSETRON HCL 4 MG/2ML IJ SOLN: 4.0000 mg | Freq: Four times a day (QID) | INTRAMUSCULAR | Status: DC | PRN

## 2024-06-27 MED ORDER — METOPROLOL SUCCINATE ER 50 MG PO TB24
100.0000 mg | ORAL_TABLET | Freq: Every day | ORAL | Status: DC
  Administered 2024-06-28: 100 mg via ORAL
  Filled 2024-06-27: qty 2

## 2024-06-27 MED ORDER — KETOROLAC TROMETHAMINE 30 MG/ML IJ SOLN
INTRAMUSCULAR | Status: AC
  Filled 2024-06-27: qty 1

## 2024-06-27 MED ORDER — DEXAMETHASONE SODIUM PHOSPHATE 10 MG/ML IJ SOLN: 8.0000 mg | Freq: Once | INTRAMUSCULAR | Status: DC

## 2024-06-27 MED ORDER — PROPOFOL 500 MG/50ML IV EMUL
INTRAVENOUS | Status: DC | PRN
  Administered 2024-06-27: 75 ug/kg/min via INTRAVENOUS

## 2024-06-27 MED ORDER — METHOCARBAMOL 500 MG PO TABS
500.0000 mg | ORAL_TABLET | Freq: Four times a day (QID) | ORAL | Status: DC | PRN
Start: 2024-06-27 — End: 2024-06-28
  Administered 2024-06-27: 500 mg via ORAL
  Filled 2024-06-27: qty 1

## 2024-06-27 MED ORDER — 0.9 % SODIUM CHLORIDE (POUR BTL) OPTIME
TOPICAL | Status: DC | PRN
  Administered 2024-06-27: 1000 mL

## 2024-06-27 MED ORDER — ALUM & MAG HYDROXIDE-SIMETH 200-200-20 MG/5ML PO SUSP
30.0000 mL | ORAL | Status: DC | PRN
Start: 2024-06-27 — End: 2024-06-28

## 2024-06-27 MED ORDER — SODIUM CHLORIDE 0.9% FLUSH: 3.0000 mL | INTRAVENOUS | Status: DC | PRN

## 2024-06-27 MED ORDER — BISACODYL 10 MG RE SUPP: 10.0000 mg | Freq: Every day | RECTAL | Status: DC | PRN

## 2024-06-27 MED ORDER — KETOROLAC TROMETHAMINE 30 MG/ML IJ SOLN
INTRAMUSCULAR | Status: DC | PRN
  Administered 2024-06-27: 30 mg via INTRAMUSCULAR

## 2024-06-27 SURGICAL SUPPLY — 44 items
ATTUNE MED ANAT PAT 35 KNEE (Knees) IMPLANT
BAG COUNTER SPONGE SURGICOUNT (BAG) IMPLANT
BAG ZIPLOCK 12X15 (MISCELLANEOUS) ×1 IMPLANT
BASEPLATE TIB CMT FB PCKT SZ6 (Knees) IMPLANT
BLADE SAW SGTL 13.0X1.19X90.0M (BLADE) ×1 IMPLANT
BNDG ELASTIC 6INX 5YD STR LF (GAUZE/BANDAGES/DRESSINGS) ×1 IMPLANT
BOWL SMART MIX CTS (DISPOSABLE) ×1 IMPLANT
CEMENT HV SMART SET (Cement) ×2 IMPLANT
COMPONENT FEM CMT ATTN NRW 5LT (Joint) IMPLANT
COVER SURGICAL LIGHT HANDLE (MISCELLANEOUS) ×1 IMPLANT
CUFF TRNQT CYL 34X4.125X (TOURNIQUET CUFF) ×1 IMPLANT
DERMABOND ADVANCED .7 DNX12 (GAUZE/BANDAGES/DRESSINGS) ×1 IMPLANT
DRAPE U-SHAPE 47X51 STRL (DRAPES) ×1 IMPLANT
DRESSING AQUACEL AG SP 3.5X10 (GAUZE/BANDAGES/DRESSINGS) ×1 IMPLANT
DURAPREP 26ML APPLICATOR (WOUND CARE) ×2 IMPLANT
ELECT REM PT RETURN 15FT ADLT (MISCELLANEOUS) ×1 IMPLANT
GLOVE BIO SURGEON STRL SZ 6 (GLOVE) ×1 IMPLANT
GLOVE BIOGEL PI IND STRL 6.5 (GLOVE) ×1 IMPLANT
GLOVE BIOGEL PI IND STRL 7.5 (GLOVE) ×1 IMPLANT
GLOVE ORTHO TXT STRL SZ7.5 (GLOVE) ×2 IMPLANT
GOWN STRL REUS W/ TWL LRG LVL3 (GOWN DISPOSABLE) ×2 IMPLANT
HOLDER FOLEY CATH W/STRAP (MISCELLANEOUS) IMPLANT
INSERT MED ATTUNE KNEE 5 6 LT (Insert) IMPLANT
KIT TURNOVER KIT A (KITS) ×1 IMPLANT
MANIFOLD NEPTUNE II (INSTRUMENTS) ×1 IMPLANT
NDL SAFETY ECLIPSE 18X1.5 (NEEDLE) IMPLANT
NS IRRIG 1000ML POUR BTL (IV SOLUTION) ×1 IMPLANT
PACK TOTAL KNEE CUSTOM (KITS) ×1 IMPLANT
PENCIL SMOKE EVACUATOR (MISCELLANEOUS) ×1 IMPLANT
PIN FIX SIGMA LCS THRD HI (PIN) IMPLANT
PROTECTOR NERVE ULNAR (MISCELLANEOUS) ×1 IMPLANT
SET HNDPC FAN SPRY TIP SCT (DISPOSABLE) ×1 IMPLANT
SET PAD KNEE POSITIONER (MISCELLANEOUS) ×1 IMPLANT
SPIKE FLUID TRANSFER (MISCELLANEOUS) ×2 IMPLANT
SUT MNCRL AB 4-0 PS2 18 (SUTURE) ×1 IMPLANT
SUT STRATAFIX PDS+ 0 24IN (SUTURE) ×1 IMPLANT
SUT VIC AB 1 CT1 36 (SUTURE) ×1 IMPLANT
SUT VIC AB 2-0 CT1 TAPERPNT 27 (SUTURE) ×2 IMPLANT
SYR 3ML LL SCALE MARK (SYRINGE) ×1 IMPLANT
TOWEL GREEN STERILE FF (TOWEL DISPOSABLE) ×1 IMPLANT
TRAY FOLEY MTR SLVR 16FR STAT (SET/KITS/TRAYS/PACK) ×1 IMPLANT
TUBE SUCTION HIGH CAP CLEAR NV (SUCTIONS) ×1 IMPLANT
WATER STERILE IRR 1000ML POUR (IV SOLUTION) ×2 IMPLANT
WRAP KNEE MAXI GEL POST OP (GAUZE/BANDAGES/DRESSINGS) ×1 IMPLANT

## 2024-06-27 NOTE — Anesthesia Postprocedure Evaluation (Signed)
 Anesthesia Post Note  Patient: Whitney Hess  Procedure(s) Performed: ARTHROPLASTY, KNEE, TOTAL (Left: Knee)     Patient location during evaluation: PACU Anesthesia Type: Regional and General Level of consciousness: awake and alert Pain management: pain level controlled Vital Signs Assessment: post-procedure vital signs reviewed and stable Respiratory status: spontaneous breathing, nonlabored ventilation, respiratory function stable and patient connected to nasal cannula oxygen Cardiovascular status: blood pressure returned to baseline and stable Postop Assessment: no apparent nausea or vomiting Anesthetic complications: no   No notable events documented.  Last Vitals:  Vitals:   06/27/24 1345 06/27/24 1400  BP: 117/62 116/69  Pulse: (!) 49 (!) 50  Resp: 12 12  Temp:    SpO2: 100% 94%    Last Pain:  Vitals:   06/27/24 1400  TempSrc:   PainSc: 2                  Lavenia Stumpo P Natilie Krabbenhoft

## 2024-06-27 NOTE — Anesthesia Procedure Notes (Signed)
 Spinal  Patient location during procedure: OR Start time: 06/27/2024 10:20 AM End time: 06/27/2024 10:22 AM Staffing Performed: anesthesiologist  Anesthesiologist: Dorethea Cordella SQUIBB, DO Performed by: Dorethea Cordella SQUIBB, DO Authorized by: Dorethea Cordella SQUIBB, DO   Preanesthetic Checklist Completed: patient identified, IV checked, site marked, risks and benefits discussed, surgical consent, monitors and equipment checked, pre-op evaluation and timeout performed Spinal Block Patient position: sitting Prep: DuraPrep Patient monitoring: heart rate, cardiac monitor, continuous pulse ox and blood pressure Approach: midline Location: L3-4 Injection technique: single-shot Needle Needle type: Pencan  Needle gauge: 24 G Needle length: 10 cm Assessment Events: CSF return Additional Notes Patient identified. Risks/Benefits/Options discussed with patient including but not limited to bleeding, infection, nerve damage, paralysis, failed block, incomplete pain control, headache, blood pressure changes, nausea, vomiting, reactions to medications, itching and postpartum back pain. Confirmed with bedside nurse the patient's most recent platelet count. Confirmed with patient that they are not currently taking any anticoagulation, have any bleeding history or any family history of bleeding disorders. Patient expressed understanding and wished to proceed. All questions were answered. Sterile technique was used throughout the entire procedure. Please see nursing notes for vital signs. Warning signs of high block given to the patient including shortness of breath, tingling/numbness in hands, complete motor block, or any concerning symptoms with instructions to call for help. Patient was given instructions on fall risk and not to get out of bed. All questions and concerns addressed with instructions to call with any issues or inadequate analgesia.

## 2024-06-27 NOTE — Anesthesia Preprocedure Evaluation (Signed)
 Anesthesia Evaluation  Patient identified by MRN, date of birth, ID band Patient awake    Reviewed: Allergy & Precautions, NPO status , Patient's Chart, lab work & pertinent test results  Airway Mallampati: II  TM Distance: >3 FB Neck ROM: Full    Dental no notable dental hx.    Pulmonary neg pulmonary ROS   Pulmonary exam normal        Cardiovascular hypertension, Pt. on medications and Pt. on home beta blockers  Rhythm:Regular Rate:Normal     Neuro/Psych CVA  negative psych ROS   GI/Hepatic Neg liver ROS,GERD  Medicated,,  Endo/Other  negative endocrine ROS    Renal/GU CRFRenal disease  negative genitourinary   Musculoskeletal  (+) Arthritis , Osteoarthritis,    Abdominal Normal abdominal exam  (+)   Peds  Hematology Lab Results      Component                Value               Date                      WBC                      5.9                 06/14/2024                HGB                      12.8                06/14/2024                HCT                      40.2                06/14/2024                MCV                      89.1                06/14/2024                PLT                      237                 06/14/2024              Anesthesia Other Findings   Reproductive/Obstetrics                             Anesthesia Physical Anesthesia Plan  ASA: 3  Anesthesia Plan: MAC, Regional and Spinal   Post-op Pain Management: Regional block*   Induction: Intravenous  PONV Risk Score and Plan: 2 and Ondansetron , Dexamethasone and Treatment may vary due to age or medical condition  Airway Management Planned: Simple Face Mask and Nasal Cannula  Additional Equipment: None  Intra-op Plan:   Post-operative Plan:   Informed Consent: I have reviewed the patients History and Physical, chart, labs and discussed the procedure including the risks, benefits and  alternatives for the proposed anesthesia with  the patient or authorized representative who has indicated his/her understanding and acceptance.     Dental advisory given  Plan Discussed with: CRNA  Anesthesia Plan Comments:        Anesthesia Quick Evaluation

## 2024-06-27 NOTE — Op Note (Signed)
 NAME:  Whitney Hess                      MEDICAL RECORD NO.:  [id number]                             FACILITY:  [facility]      PHYSICIAN:  Whitney Hess, M.D.  DATE OF BIRTH:  10/17/1951      DATE OF PROCEDURE:  06/27/2024                                     OPERATIVE REPORT         PREOPERATIVE DIAGNOSIS:  Left knee osteoarthritis.      POSTOPERATIVE DIAGNOSIS:  Left knee osteoarthritis.      FINDINGS:  The patient was noted to have complete loss of cartilage and   bone-on-bone arthritis with associated osteophytes in the medial and patellofemoral compartments of   the knee with a significant synovitis and associated effusion.  The patient had failed months of conservative treatment including medications, injection therapy, activity modification.     PROCEDURE:  Left total knee replacement.      COMPONENTS USED:  DePuy Attune FB CR MS knee   system, a size 5N femur, 6 tibia, size 6 mm CR MS AOX insert, and 35 anatomic patellar   button.      SURGEON:  Whitney Hess, M.D.      ASSISTANT:  Whitney Calin, PA-C.      ANESTHESIA:  Regional and Spinal.      SPECIMENS:  None.      COMPLICATION:  None.      DRAINS:  None.  EBL: <100 cc      TOURNIQUET TIME:  30 min at 225 mmHg     The patient was stable to the recovery room.      INDICATION FOR PROCEDURE:  Whitney Hess is a 73 y.o. female patient of   mine.  The patient had been seen, evaluated, and treated for months conservatively in the   office with medication, activity modification, and injections.  The patient had   radiographic changes of bone-on-bone arthritis with endplate sclerosis and osteophytes noted.  Based on the radiographic changes and failed conservative measures, the patient   decided to proceed with definitive treatment, total knee replacement.  Risks of infection, DVT, component failure, need for revision surgery, neurovascular injury were reviewed in the office setting.  The postop course was reviewed  stressing the efforts to maximize post-operative satisfaction and function.  Consent was obtained for benefit of pain   relief.      PROCEDURE IN DETAIL:  The patient was brought to the operative theater.   Once adequate anesthesia, preoperative antibiotics, 2 gm of Ancef,1 gm of Tranexamic Acid, and 10 mg of Decadron administered, the patient was positioned supine with a left thigh tourniquet placed.  The  left lower extremity was prepped and draped in sterile fashion.  A time-   out was performed identifying the patient, planned procedure, and the appropriate extremity.      The left lower extremity was placed in the [facility] leg holder.  The leg was   exsanguinated, tourniquet elevated to 225 mmHg.  A midline incision was   made followed by median parapatellar arthrotomy.  Following initial   exposure, attention was first directed  to the patella.  Precut   measurement was noted to be 22 mm.  I resected down to 13 mm and used a   35 anatomic patellar button to restore patellar height as well as cover the cut surface.      The lug holes were drilled and a metal shim was placed to protect the   patella from retractors and saw blade during the procedure.      At this point, attention was now directed to the femur.  The femoral   canal was opened with a drill, irrigated to try to prevent fat emboli.  An   intramedullary rod was passed at 3 degrees valgus, 9 mm of bone was   resected off the distal femur.  Following this resection, the tibia was   subluxated anteriorly.  Using the extramedullary guide, 2 mm of bone was resected off   the proximal medial tibia.  We confirmed the gap would be   stable medially and laterally with a size 5 spacer block as well as confirmed that the tibial cut was perpendicular in the coronal plane, checking with an alignment rod.      Once this was done, I sized the femur to be a size 5 in the anterior-   posterior dimension, chose a narrow component based on  medial and   lateral dimension.  The size 5 rotation block was then pinned in   position anterior referenced using the C-clamp to set rotation.  The   anterior, posterior, and  chamfer cuts were made without difficulty nor   notching making certain that I was along the anterior cortex to help   with flexion gap stability.      The final box cut was made off the lateral aspect of distal femur.      At this point, the tibia was sized to be a size 6.  The size 6 tray was   then pinned in position through the medial third of the tubercle,   drilled, and keel punched.  Trial reduction was now carried with a 5 femur,  6 tibia, a size 6 mm CR MS insert, and the 35 anatomic patella botton.  The knee was brought to full extension with good flexion stability with the patella   tracking through the trochlea without application of pressure.  Given   all these findings the trial components removed.  Final components were   opened and cement was mixed.  The knee was irrigated with normal saline solution and pulse lavage.  The synovial lining was   then injected with 30 cc of 0.25% Marcaine with epinephrine, 1 cc of Toradol  and 30 cc of NS for a total of 61 cc.     Final implants were then cemented onto cleaned and dried cut surfaces of bone with the knee brought to extension with a size 6 mm CR MS trial insert.      Once the cement had fully cured, excess cement was removed   throughout the knee.  I confirmed that I was satisfied with the range of   motion and stability, and the final size 6 mm CR MS AOX insert was chosen.  It was   placed into the knee.      The tourniquet had been let down at 30 minutes.  No significant   hemostasis was required.  The extensor mechanism was then reapproximated using #1 Vicryl and #1 Stratafix sutures with the knee   in flexion.  The  remaining wound was closed with 2-0 Vicryl and running 4-0 Monocryl.   The knee was cleaned, dried, dressed sterilely using Dermabond  and   Aquacel dressing.  The patient was then   brought to recovery room in stable condition, tolerating the procedure   well.   Please note that Physician Assistant, Whitney Calin, PA-C was present for the entirety of the case, and was utilized for pre-operative positioning, peri-operative retractor management, general facilitation of the procedure and for primary wound closure at the end of the case.              Whitney CORDOBA Hess, M.D.    06/27/2024 8:47 AM

## 2024-06-27 NOTE — Transfer of Care (Signed)
 Immediate Anesthesia Transfer of Care Note  Patient: Whitney Hess  Procedure(s) Performed: ARTHROPLASTY, KNEE, TOTAL (Left: Knee)  Patient Location: PACU  Anesthesia Type:GA combined with regional for post-op pain  Level of Consciousness: sedated, drowsy, and patient cooperative  Airway & Oxygen Therapy: Patient Spontanous Breathing and Patient connected to face mask oxygen  Post-op Assessment: Report given to RN and Post -op Vital signs reviewed and stable  Post vital signs: stable  Last Vitals:  Vitals Value Taken Time  BP 130/65 06/27/24 12:08  Temp    Pulse 60 06/27/24 12:11  Resp 14 06/27/24 12:11  SpO2 100 % 06/27/24 12:11  Vitals shown include unfiled device data.  Last Pain:  Vitals:   06/27/24 0812  TempSrc: Oral  PainSc:          Complications: No notable events documented.

## 2024-06-27 NOTE — Anesthesia Procedure Notes (Deleted)
 Procedure Name: LMA Insertion Date/Time: 06/27/2024 10:49 AM  Performed by: Emilio Rock DEL, CRNAPre-anesthesia Checklist: Patient identified, Emergency Drugs available, Suction available and Patient being monitored Patient Re-evaluated:Patient Re-evaluated prior to induction Oxygen Delivery Method: Circle system utilized Preoxygenation: Pre-oxygenation with 100% oxygen Induction Type: IV induction LMA: LMA inserted LMA Size: 4.0 Number of attempts: 1 Dental Injury: Teeth and Oropharynx as per pre-operative assessment

## 2024-06-27 NOTE — Evaluation (Signed)
 Physical Therapy Evaluation Patient Details Name: Whitney Hess MRN: [id number] DOB: [birth year] Today's Date: 06/27/2024  History of Present Illness  Pt is 73 yo female admitted 06/27/24 for L TKA.  Pt with hx including but not limited to arthritis, CKD, CVA, HTN, ACDF, back sx, brain sx  Clinical Impression  Pt is s/p TKA resulting in the deficits listed below (see PT Problem List). Pt independent at baseline and has home support. PT with orders to assess for possible SDDC.  Pt seen >4 hr after surgery but still with some effects of spinal and not able to ambulate safely.  Would recommend further therapy prior to return home - discussed with pt and she is in agreement.  Pt will benefit from acute skilled PT to increase their independence and safety with mobility to allow discharge.          If plan is discharge home, recommend the following: A lot of help with walking and/or transfers;A lot of help with bathing/dressing/bathroom   Can travel by private vehicle        Equipment Recommendations Rolling walker (2 wheels) (given in PACU)  Recommendations for Other Services       Functional Status Assessment Patient has had a recent decline in their functional status and demonstrates the ability to make significant improvements in function in a reasonable and predictable amount of time.     Precautions / Restrictions Precautions Precautions: Fall Restrictions Weight Bearing Restrictions Per Provider Order: Yes LLE Weight Bearing Per Provider Order: Weight bearing as tolerated      Mobility  Bed Mobility Overal bed mobility: Needs Assistance Bed Mobility: Supine to Sit, Sit to Supine     Supine to sit: Min assist Sit to supine: Min assist        Transfers Overall transfer level: Needs assistance Equipment used: Rolling walker (2 wheels) Transfers: Sit to/from Stand Sit to Stand: Min assist           General transfer comment: cues for hand placmeent ; min A to  stabilize    Ambulation/Gait Ambulation/Gait assistance: Min assist Gait Distance (Feet): 8 Feet Assistive device: Rolling walker (2 wheels) Gait Pattern/deviations: Step-to pattern, Decreased stride length, Decreased weight shift to left Gait velocity: decreased     General Gait Details: Pt reports some numbness in foot upon standing, able to weight shift onto L leg without buckling but with walking was unsteady needing min A to stabilize.  Had pt return to bed and discusse dstill with effects of spinal and would recommend not returning home same day, pt agrees  Acupuncturist Bed    Modified Rankin (Stroke Patients Only)       Balance Overall balance assessment: Needs assistance Sitting-balance support: No upper extremity supported Sitting balance-Leahy Scale: Good     Standing balance support: Bilateral upper extremity supported Standing balance-Leahy Scale: Poor Standing balance comment: RW and min A                             Pertinent Vitals/Pain Pain Assessment Pain Assessment: 0-10 Pain Score: 2  Pain Location: L TKA Pain Descriptors / Indicators: Discomfort Pain Intervention(s): Limited activity within patient's tolerance, Monitored during session, Premedicated before session, Repositioned    Home Living Family/patient expects to be discharged to:: Private residence Living Arrangements: Spouse/significant other Available Help at Discharge: Family;Available 24 hours/day  Type of Home: House Home Access: Stairs to enter Entrance Stairs-Rails: None (wall) Entrance Stairs-Number of Steps: 1 platform step then 1 step into house   Home Layout: Multi-level;Able to live on main level with bedroom/bathroom Home Equipment: Grab bars - tub/shower;Shower seat;Cane - single point      Prior Function Prior Level of Function : Independent/Modified Independent             Mobility Comments: could ambulate in  community; was having to use cane recently ADLs Comments: independent adls and iadls; retired     Extremity/Trunk Assessment   Upper Extremity Assessment Upper Extremity Assessment: Overall WFL for tasks assessed    Lower Extremity Assessment Lower Extremity Assessment: LLE deficits/detail;RLE deficits/detail RLE Deficits / Details: ROM WFL; MMT : knee ext 5/5, ankle DF 5/5; unable to gluteal squeeze LLE Deficits / Details: Expected post op changes but still with effects of spinal.  Unable to gluteal squeeze - reports gluts and groin still numb.  Upon standing reports foot also numb.  ROM WFL; ankle: 5/5, knee ext 2/5, hip 1/5 but unable to gluteal squeeze LLE Sensation: decreased light touch    Cervical / Trunk Assessment Cervical / Trunk Assessment: Normal  Communication        Cognition Arousal: Alert Behavior During Therapy: WFL for tasks assessed/performed   PT - Cognitive impairments: No apparent impairments                                 Cueing       General Comments      Exercises     Assessment/Plan    PT Assessment Patient needs continued PT services  PT Problem List Decreased strength;Pain;Decreased range of motion;Decreased activity tolerance;Decreased balance;Decreased mobility;Decreased knowledge of use of DME       PT Treatment Interventions DME instruction;Therapeutic exercise;Gait training;Stair training;Functional mobility training;Therapeutic activities;Patient/family education;Balance training;Modalities    PT Goals (Current goals can be found in the Care Plan section)  Acute Rehab PT Goals Patient Stated Goal: return home PT Goal Formulation: With patient Time For Goal Achievement: 07/11/24 Potential to Achieve Goals: Good    Frequency 7X/week     Co-evaluation               AM-PAC PT 6 Clicks Mobility  Outcome Measure Help needed turning from your back to your side while in a flat bed without using bedrails?: A  Little Help needed moving from lying on your back to sitting on the side of a flat bed without using bedrails?: A Little Help needed moving to and from a bed to a chair (including a wheelchair)?: A Little Help needed standing up from a chair using your arms (e.g., wheelchair or bedside chair)?: A Little Help needed to walk in hospital room?: A Lot Help needed climbing 3-5 steps with a railing? : A Lot 6 Click Score: 16    End of Session Equipment Utilized During Treatment: Gait belt Activity Tolerance: Patient tolerated treatment well Patient left: in bed;with call bell/phone within reach;with family/visitor present Nurse Communication: Mobility status;Other (comment) (not safe for SDDC) PT Visit Diagnosis: Other abnormalities of gait and mobility (R26.89);Muscle weakness (generalized) (M62.81)    Time: 8379-8355 PT Time Calculation (min) (ACUTE ONLY): 24 min   Charges:   PT Evaluation $PT Eval Low Complexity: 1 Low PT Treatments $Gait Training: 8-22 mins PT General Charges $$ ACUTE PT VISIT: 1 Visit  Danalee Flath, PT Acute Rehab [facility] [phone]   Benjiman VEAR Mulberry 06/27/2024, 5:17 PM

## 2024-06-27 NOTE — Interval H&P Note (Signed)
 History and Physical Interval Note:  06/27/2024 8:47 AM  Whitney Hess  has presented today for surgery, with the diagnosis of Left Knee Osteoarthritis.  The various methods of treatment have been discussed with the patient and family. After consideration of risks, benefits and other options for treatment, the patient has consented to  Procedure(s): ARTHROPLASTY, KNEE, TOTAL (Left) as a surgical intervention.  The patient's history has been reviewed, patient examined, no change in status, stable for surgery.  I have reviewed the patient's chart and labs.  Questions were answered to the patient's satisfaction.     Donnice JONETTA Car

## 2024-06-27 NOTE — Anesthesia Procedure Notes (Signed)
 Anesthesia Regional Block: Adductor canal block   Pre-Anesthetic Checklist: , timeout performed,  Correct Patient, Correct Site, Correct Laterality,  Correct Procedure, Correct Position, site marked,  Risks and benefits discussed,  Surgical consent,  Pre-op evaluation,  At surgeon's request and post-op pain management  Laterality: Left  Prep: Dura Prep       Needles:  Injection technique: Single-shot  Needle Type: Echogenic Stimulator Needle     Needle Length: 10cm  Needle Gauge: 20     Additional Needles:   Procedures:,,,, ultrasound used (permanent image in chart),,    Narrative:  Start time: 06/27/2024 9:21 AM End time: 06/27/2024 9:26 AM Injection made incrementally with aspirations every 5 mL.  Performed by: Personally  Anesthesiologist: Dorethea Cordella SQUIBB, DO  Additional Notes: Patient identified. Risks/Benefits/Options discussed with patient including but not limited to bleeding, infection, nerve damage, failed block, incomplete pain control. Patient expressed understanding and wished to proceed. All questions were answered. Sterile technique was used throughout the entire procedure. Please see nursing notes for vital signs. Aspirated in 5cc intervals with injection for negative confirmation. Patient was given instructions on fall risk and not to get out of bed. All questions and concerns addressed with instructions to call with any issues or inadequate analgesia.

## 2024-06-27 NOTE — Anesthesia Procedure Notes (Signed)
 Procedure Name: LMA Insertion Date/Time: 06/27/2024 10:49 AM  Performed by: Delores Duwaine SAUNDERS, CRNAPre-anesthesia Checklist: Patient identified, Emergency Drugs available, Suction available and Patient being monitored Patient Re-evaluated:Patient Re-evaluated prior to induction Oxygen Delivery Method: Circle system utilized Preoxygenation: Pre-oxygenation with 100% oxygen Induction Type: IV induction Ventilation: Mask ventilation without difficulty LMA: LMA inserted LMA Size: 4.0 Number of attempts: 1 Dental Injury: Teeth and Oropharynx as per pre-operative assessment

## 2024-06-27 NOTE — Discharge Instructions (Addendum)
 INSTRUCTIONS AFTER JOINT REPLACEMENT   Take aspirin 81 mg BID for 4 weeks  Pain Regimen Instructions:  - Take the muscle relaxant around the clock  - Take the pain medication (hydrocodone) 1 tablet every 4 hours as needed for severe pain - Each pain pill has 325 mg of tylenol in it - you may supplement with tylenol if not taking the pain medication frequently, but do not exceed 4000 mg total in 24 hours. - Ice and elevate your leg as often as you can  Do not take over the counter pain medication until instructed by us .  If this is not controlling your pain, or you need refills - call our office at [phone] or send a message via the [employer] portal.   Take the stool softeners provided until you are having regular bowel movements, and then you may discontinue.    Remove items at home which could result in a fall. This includes throw rugs or furniture in walking pathways ICE to the affected joint every three hours while awake for 30 minutes at a time, for at least the first 3-5 days, and then as needed for pain and swelling.  Continue to use ice for pain and swelling. You may notice swelling that will progress down to the foot and ankle.  This is normal after surgery.  Elevate your leg when you are not up walking on it.   Continue to use the breathing machine you got in the hospital (incentive spirometer) which will help keep your temperature down.  It is common for your temperature to cycle up and down following surgery, especially at night when you are not up moving around and exerting yourself.  The breathing machine keeps your lungs expanded and your temperature down.   DIET:  As you were doing prior to hospitalization, we recommend a well-balanced diet.  DRESSING / WOUND CARE / SHOWERING  Keep the surgical dressing until follow up.  The dressing is water proof, so you can shower without any extra covering.  IF THE DRESSING FALLS OFF or the wound gets wet inside, change the dressing  with sterile gauze.  Please use good hand washing techniques before changing the dressing.  Do not use any lotions or creams on the incision until instructed by your surgeon.    ACTIVITY  Increase activity slowly as tolerated, but follow the weight bearing instructions below.   No driving for 6 weeks or until further direction given by your physician.  You cannot drive while taking narcotics.  No lifting or carrying greater than 10 lbs. until further directed by your surgeon. Avoid periods of inactivity such as sitting longer than an hour when not asleep. This helps prevent blood clots.  You may return to work once you are authorized by your doctor.     WEIGHT BEARING   Weight bearing as tolerated with assist device (walker, cane, etc) as directed, use it as long as suggested by your surgeon or therapist, typically at least 4-6 weeks.   EXERCISES  Results after joint replacement surgery are often greatly improved when you follow the exercise, range of motion and muscle strengthening exercises prescribed by your doctor. Safety measures are also important to protect the joint from further injury. Any time any of these exercises cause you to have increased pain or swelling, decrease what you are doing until you are comfortable again and then slowly increase them. If you have problems or questions, call your caregiver or physical therapist for advice.  Rehabilitation is important following a joint replacement. After just a few days of immobilization, the muscles of the leg can become weakened and shrink (atrophy).  These exercises are designed to build up the tone and strength of the thigh and leg muscles and to improve motion. Often times heat used for twenty to thirty minutes before working out will loosen up your tissues and help with improving the range of motion but do not use heat for the first two weeks following surgery (sometimes heat can increase post-operative swelling).   These  exercises can be done on a training (exercise) mat, on the floor, on a table or on a bed. Use whatever works the best and is most comfortable for you.    Use music or television while you are exercising so that the exercises are a pleasant break in your day. This will make your life better with the exercises acting as a break in your routine that you can look forward to.   Perform all exercises about fifteen times, three times per day or as directed.  You should exercise both the operative leg and the other leg as well.  Exercises include:   Quad Sets - Tighten up the muscle on the front of the thigh (Quad) and hold for 5-10 seconds.   Straight Leg Raises - With your knee straight (if you were given a brace, keep it on), lift the leg to 60 degrees, hold for 3 seconds, and slowly lower the leg.  Perform this exercise against resistance later as your leg gets stronger.  Leg Slides: Lying on your back, slowly slide your foot toward your buttocks, bending your knee up off the floor (only go as far as is comfortable). Then slowly slide your foot back down until your leg is flat on the floor again.  Angel Wings: Lying on your back spread your legs to the side as far apart as you can without causing discomfort.  Hamstring Strength:  Lying on your back, push your heel against the floor with your leg straight by tightening up the muscles of your buttocks.  Repeat, but this time bend your knee to a comfortable angle, and push your heel against the floor.  You may put a pillow under the heel to make it more comfortable if necessary.   A rehabilitation program following joint replacement surgery can speed recovery and prevent re-injury in the future due to weakened muscles. Contact your doctor or a physical therapist for more information on knee rehabilitation.    CONSTIPATION  Constipation is defined medically as fewer than three stools per week and severe constipation as less than one stool per week.  Even if  you have a regular bowel pattern at home, your normal regimen is likely to be disrupted due to multiple reasons following surgery.  Combination of anesthesia, postoperative narcotics, change in appetite and fluid intake all can affect your bowels.   YOU MUST use at least one of the following options; they are listed in order of increasing strength to get the job done.  They are all available over the counter, and you may need to use some, POSSIBLY even all of these options:    Drink plenty of fluids (prune juice may be helpful) and high fiber foods Colace 100 mg by mouth twice a day  Senokot for constipation as directed and as needed Dulcolax (bisacodyl), take with full glass of water  Miralax (polyethylene glycol) once or twice a day as needed.  If you have  tried all these things and are unable to have a bowel movement in the first 3-4 days after surgery call either your surgeon or your primary doctor.    If you experience loose stools or diarrhea, hold the medications until you stool forms back up.  If your symptoms do not get better within 1 week or if they get worse, check with your doctor.  If you experience the worst abdominal pain ever or develop nausea or vomiting, please contact the office immediately for further recommendations for treatment.   ITCHING:  If you experience itching with your medications, try taking only a single pain pill, or even half a pain pill at a time.  You can also use Benadryl over the counter for itching or also to help with sleep.   TED HOSE STOCKINGS:  Use stockings on both legs until for at least 2 weeks or as directed by physician office. They may be removed at night for sleeping.  MEDICATIONS:  See your medication summary on the "After Visit Summary" that nursing will review with you.  You may have some home medications which will be placed on hold until you complete the course of blood thinner medication.  It is important for you to complete the blood  thinner medication as prescribed.  PRECAUTIONS:  If you experience chest pain or shortness of breath - call 911 immediately for transfer to the hospital emergency department.   If you develop a fever greater that 101 F, purulent drainage from wound, increased redness or drainage from wound, foul odor from the wound/dressing, or calf pain - CONTACT YOUR SURGEON.                                                   FOLLOW-UP APPOINTMENTS:  If you do not already have a post-op appointment, please call the office for an appointment to be seen by your surgeon.  Guidelines for how soon to be seen are listed in your "After Visit Summary", but are typically between 1-4 weeks after surgery.  OTHER INSTRUCTIONS:   Knee Replacement:  Do not place pillow under knee, focus on keeping the knee straight while resting. CPM instructions: 0-90 degrees, 2 hours in the morning, 2 hours in the afternoon, and 2 hours in the evening. Place foam block, curve side up under heel at all times except when in CPM or when walking.  DO NOT modify, tear, cut, or change the foam block in any way.  POST-OPERATIVE OPIOID TAPER INSTRUCTIONS: It is important to wean off of your opioid medication as soon as possible. If you do not need pain medication after your surgery it is ok to stop day one. Opioids include: Codeine, Hydrocodone(Norco, Vicodin), Oxycodone(Percocet, oxycontin) and hydromorphone amongst others.  Long term and even short term use of opiods can cause: Increased pain response Dependence Constipation Depression Respiratory depression And more.  Withdrawal symptoms can include Flu like symptoms Nausea, vomiting And more Techniques to manage these symptoms Hydrate well Eat regular healthy meals Stay active Use relaxation techniques(deep breathing, meditating, yoga) Do Not substitute Alcohol to help with tapering If you have been on opioids for less than two weeks and do not have pain than it is ok to stop all  together.  Plan to wean off of opioids This plan should start within one week post op of your joint  replacement. Maintain the same interval or time between taking each dose and first decrease the dose.  Cut the total daily intake of opioids by one tablet each day Next start to increase the time between doses. The last dose that should be eliminated is the evening dose.   MAKE SURE YOU:  Understand these instructions.  Get help right away if you are not doing well or get worse.    Thank you for letting us  be a part of your medical care team.  It is a privilege we respect greatly.  We hope these instructions will help you stay on track for a fast and full recovery!

## 2024-06-28 ENCOUNTER — Encounter (HOSPITAL_COMMUNITY): Payer: Self-pay | Admitting: Orthopedic Surgery

## 2024-06-28 DIAGNOSIS — M1712 Unilateral primary osteoarthritis, left knee: Secondary | ICD-10-CM | POA: Diagnosis not present

## 2024-06-28 LAB — BASIC METABOLIC PANEL WITH GFR
Anion gap: 6 (ref 5–15)
BUN: 20 mg/dL (ref 8–23)
CO2: 23 mmol/L (ref 22–32)
Calcium: 8.9 mg/dL (ref 8.9–10.3)
Chloride: 108 mmol/L (ref 98–111)
Creatinine, Ser: 1.11 mg/dL — ABNORMAL HIGH (ref 0.44–1.00)
GFR, Estimated: 52 mL/min — ABNORMAL LOW (ref >60)
Glucose, Bld: 133 mg/dL — ABNORMAL HIGH (ref 70–99)
Potassium: 4.6 mmol/L (ref 3.5–5.1)
Sodium: 137 mmol/L (ref 135–145)

## 2024-06-28 LAB — CBC
HCT: 32.9 % — ABNORMAL LOW (ref 36.0–46.0)
Hemoglobin: 10.4 g/dL — ABNORMAL LOW (ref 12.0–15.0)
MCH: 28.3 pg (ref 26.0–34.0)
MCHC: 31.6 g/dL (ref 30.0–36.0)
MCV: 89.4 fL (ref 80.0–100.0)
Platelets: 208 10*3/uL (ref 150–400)
RBC: 3.68 MIL/uL — ABNORMAL LOW (ref 3.87–5.11)
RDW: 13.6 % (ref 11.5–15.5)
WBC: 11.8 10*3/uL — ABNORMAL HIGH (ref 4.0–10.5)
nRBC: 0 % (ref 0.0–0.2)

## 2024-06-28 MED ORDER — ASPIRIN 81 MG PO CHEW: 81.0000 mg | CHEWABLE_TABLET | Freq: Two times a day (BID) | ORAL | 0 refills | Status: AC

## 2024-06-28 NOTE — TOC Transition Note (Signed)
 Transition of Care [facility]) - Discharge Note   Patient Details  Name: Vinette Crites MRN: [id number] Date of Birth: 07/25/1951  [facility] CM/SW Contact:  NORMAN ASPEN, LCSW Phone Number: 06/28/2024, 10:11 AM   Clinical Narrative:    Met with pt who confirms she has needed DME in the home.  OPPT already arranged in [facility].  No further TOC needs.   Final next level of care: OP Rehab Barriers to Discharge: No Barriers Identified   Patient Goals and CMS Choice Patient states their goals for this hospitalization and ongoing recovery are:: return home          Discharge Placement                       Discharge Plan and Services Additional resources added to the After Visit Summary for                  DME Arranged: N/A DME Agency: NA                  Social Drivers of Health (SDOH) Interventions SDOH Screenings   Food Insecurity: No Food Insecurity (06/27/2024)  Housing: Low Risk  (06/27/2024)  Transportation Needs: No Transportation Needs (06/27/2024)  Utilities: Not At Risk (06/27/2024)  Social Connections: Unknown (06/27/2024)  Tobacco Use: Low Risk  (06/27/2024)     Readmission Risk Interventions     No data to display

## 2024-06-28 NOTE — Discharge Summary (Signed)
 Patient ID: Whitney Hess MRN: [id number] DOB/AGE: 08/26/1951 73 y.o.  Admit date: 06/27/2024 Discharge date: 06/28/2024  Admission Diagnoses:  Principal Problem:   S/P total knee replacement, left Active Problems:   S/P total knee arthroplasty, left   Discharge Diagnoses:  Same  Past Medical History:  Diagnosis Date   Arthritis    Chronic kidney disease    stage 3   Heart murmur    Hypertension    Stroke [facility])     Surgeries: Procedure(s): ARTHROPLASTY, KNEE, TOTAL on 06/27/2024   Consultants:   Discharged Condition: Improved  Hospital Course: Whitney Hess is an 73 y.o. female who was admitted 06/27/2024 for operative treatment ofS/P total knee replacement, left. Patient has severe unremitting pain that affects sleep, daily activities, and work/hobbies. After pre-op clearance the patient was taken to the operating room on 06/27/2024 and underwent  Procedure(s): ARTHROPLASTY, KNEE, TOTAL.    Patient was given perioperative antibiotics:  Anti-infectives (From admission, onward)    Start     Dose/Rate Route Frequency Ordered Stop   06/27/24 1600  ceFAZolin (ANCEF) IVPB 2g/100 mL premix        2 g 200 mL/hr over 30 Minutes Intravenous Every 6 hours 06/27/24 1516 06/28/24 0359   06/27/24 0745  ceFAZolin (ANCEF) IVPB 2g/100 mL premix        2 g 200 mL/hr over 30 Minutes Intravenous On call to O.R. 06/27/24 0741 06/27/24 1027        Patient was given sequential compression devices, early ambulation, and chemoprophylaxis to prevent DVT.  Patient benefited maximally from hospital stay and there were no complications.    Recent vital signs: Patient Vitals for the past 24 hrs:  BP Temp Temp src Pulse Resp SpO2  06/28/24 0923 117/67 (!) 97.5 F (36.4 C) Oral 70 17 98 %  06/28/24 0711 (!) 124/57 98 F (36.7 C) Oral 70 17 95 %  06/28/24 0134 127/69 97.9 F (36.6 C) Oral 68 17 98 %  06/27/24 2200 122/68 98.1 F (36.7 C) Oral 80 17 98 %  06/27/24 1900 (!) 145/71 98 F (36.7 C)  Oral 74 16 98 %  06/27/24 1845 -- -- -- 75 -- 98 %  06/27/24 1838 -- -- -- 74 -- 97 %  06/27/24 1726 (!) 145/77 -- -- 75 -- 98 %  06/27/24 1630 (!) 140/73 -- -- 65 -- 100 %  06/27/24 1615 120/75 -- -- 68 -- 100 %  06/27/24 1600 132/79 -- -- 71 -- 100 %     Recent laboratory studies:  Recent Labs    06/28/24 0327  WBC 11.8*  HGB 10.4*  HCT 32.9*  PLT 208  NA 137  K 4.6  CL 108  CO2 23  BUN 20  CREATININE 1.11*  GLUCOSE 133*  CALCIUM 8.9     Discharge Medications:   Allergies as of 06/28/2024       Reactions   Codeine Nausea And Vomiting, Swelling   AIRWAY SWELLING,VOMITING   Amlodipine Besylate Hypertension   Celecoxib Other (See Comments)   HAIR LOSS   Sulfamethoxazole-trimethoprim Itching   Tramadol Itching        Medication List     STOP taking these medications    aspirin 325 MG tablet Replaced by: aspirin 81 MG chewable tablet       TAKE these medications    ALPHA-LIPOIC ACID PO Take 2 capsules by mouth in the morning.   ASCORBIC ACID PO Take 1 tablet by mouth in the morning  and at bedtime.   aspirin 81 MG chewable tablet Chew 1 tablet (81 mg total) by mouth 2 (two) times daily for 21 days. Replaces: aspirin 325 MG tablet   BIOTIN PO Take 1 tablet by mouth every evening.   cetirizine 10 MG tablet Commonly known as: ZYRTEC Take 10 mg by mouth in the morning.   colchicine 0.6 MG tablet Take 0.6 mg by mouth in the morning.   furosemide 40 MG tablet Commonly known as: LASIX Take 40 mg by mouth daily in the afternoon.   losartan 50 MG tablet Commonly known as: COZAAR Take 100 mg by mouth in the morning.   MAGNESIUM PO Take 1 tablet by mouth in the morning and at bedtime.   metoprolol succinate 50 MG 24 hr tablet Commonly known as: TOPROL-XL Take 100 mg by mouth in the morning. Take with or immediately following a meal.   NIACIN PO Take 1 tablet by mouth every evening.   OYSTER SHELL CALCIUM + D3 PO Take 1 tablet by mouth in  the morning and at bedtime.   pantoprazole 40 MG tablet Commonly known as: PROTONIX Take 40 mg by mouth in the morning and at bedtime.   potassium chloride SA 20 MEQ tablet Commonly known as: KLOR-CON M Take 20 mEq by mouth daily in the afternoon.   pravastatin 20 MG tablet Commonly known as: PRAVACHOL Take 20 mg by mouth at bedtime.   SELENIUM PO Take 1 capsule by mouth every evening.   VITAMIN E PO Take 1 capsule by mouth in the morning.   ZINC PO Take 1 capsule by mouth every evening.               Discharge Care Instructions  (From admission, onward)           Start     Ordered   06/27/24 0000  Change dressing       Comments: Maintain surgical dressing until follow up in the clinic. If the edges start to pull up, may reinforce with tape. If the dressing is no longer working, may remove and cover with gauze and tape, but must keep the area dry and clean.  Call with any questions or concerns.   06/27/24 1154            Diagnostic Studies: No results found.  Disposition: Discharge disposition: 01-Home or Self Care       Discharge Instructions     Call MD / Call 911   Complete by: As directed    If you experience chest pain or shortness of breath, CALL 911 and be transported to the hospital emergency room.  If you develope a fever above 101 F, pus (white drainage) or increased drainage or redness at the wound, or calf pain, call your surgeon's office.   Change dressing   Complete by: As directed    Maintain surgical dressing until follow up in the clinic. If the edges start to pull up, may reinforce with tape. If the dressing is no longer working, may remove and cover with gauze and tape, but must keep the area dry and clean.  Call with any questions or concerns.   Constipation Prevention   Complete by: As directed    Drink plenty of fluids.  Prune juice may be helpful.  You may use a stool softener, such as Colace (over the counter) 100 mg twice a  day.  Use MiraLax (over the counter) for constipation as needed.   Diet - low  sodium heart healthy   Complete by: As directed    Increase activity slowly as tolerated   Complete by: As directed    Weight bearing as tolerated with assist device (walker, cane, etc) as directed, use it as long as suggested by your surgeon or therapist, typically at least 4-6 weeks.   Post-operative opioid taper instructions:   Complete by: As directed    POST-OPERATIVE OPIOID TAPER INSTRUCTIONS: It is important to wean off of your opioid medication as soon as possible. If you do not need pain medication after your surgery it is ok to stop day one. Opioids include: Codeine, Hydrocodone(Norco, Vicodin), Oxycodone(Percocet, oxycontin) and hydromorphone amongst others.  Long term and even short term use of opiods can cause: Increased pain response Dependence Constipation Depression Respiratory depression And more.  Withdrawal symptoms can include Flu like symptoms Nausea, vomiting And more Techniques to manage these symptoms Hydrate well Eat regular healthy meals Stay active Use relaxation techniques(deep breathing, meditating, yoga) Do Not substitute Alcohol to help with tapering If you have been on opioids for less than two weeks and do not have pain than it is ok to stop all together.  Plan to wean off of opioids This plan should start within one week post op of your joint replacement. Maintain the same interval or time between taking each dose and first decrease the dose.  Cut the total daily intake of opioids by one tablet each day Next start to increase the time between doses. The last dose that should be eliminated is the evening dose.      TED hose   Complete by: As directed    Use stockings (TED hose) for 2 weeks on both leg(s).  You may remove them at night for sleeping.        Follow-up Information     Ernie Cough, MD. Schedule an appointment as soon as possible for a visit in 2  week(s).   Specialty: Orthopedic Surgery Contact information: [address] [place] [address] [place] [address] [phone]                  Signed: Roxie Mess 06/28/2024, 3:26 PM

## 2024-06-28 NOTE — Plan of Care (Signed)
  Problem: Elimination: Goal: Will not experience complications related to urinary retention Outcome: Progressing   Problem: Pain Managment: Goal: General experience of comfort will improve and/or be controlled Outcome: Progressing   Problem: Activity: Goal: Ability to avoid complications of mobility impairment will improve Outcome: Progressing Goal: Range of joint motion will improve Outcome: Progressing   Problem: Pain Management: Goal: Pain level will decrease with appropriate interventions Outcome: Progressing

## 2024-06-28 NOTE — Progress Notes (Addendum)
 Physical Therapy Treatment Patient Details Name: Whitney Hess MRN: [id number] DOB: 01/04/1951 Today's Date: 06/28/2024   History of Present Illness Pt is 73 yo female admitted 06/27/24 for L TKA.  Pt with hx including but not limited to arthritis, CKD, CVA, HTN, ACDF, back sx, brain sx    PT Comments  Pt is POD # 1 and is progressing well.  Pt reports return of all sensation and feeling much more steady today.  She had good pain control, ROM, quad activation, understanding of HEP and safety.  Pt ambulating 150' with RW and performed stairs similar to home set up.  Pt demonstrates safe gait & transfers in order to return home from PT perspective once discharged by MD.  While in hospital, will continue to benefit from PT for skilled therapy to advance mobility and exercises.       If plan is discharge home, recommend the following: A little help with bathing/dressing/bathroom;A little help with walking and/or transfers   Can travel by private vehicle        Equipment Recommendations  Rolling walker (2 wheels) (has from PACU)    Recommendations for Other Services       Precautions / Restrictions Precautions Precautions: Fall;Knee Restrictions LLE Weight Bearing Per Provider Order: Weight bearing as tolerated     Mobility  Bed Mobility Overal bed mobility: Needs Assistance Bed Mobility: Supine to Sit, Sit to Supine     Supine to sit: Modified independent (Device/Increase time) Sit to supine: Modified independent (Device/Increase time)   General bed mobility comments: educated on use of gait belt to assist L LE as needed    Transfers Overall transfer level: Needs assistance Equipment used: Rolling walker (2 wheels) Transfers: Sit to/from Stand Sit to Stand: Supervision           General transfer comment: Stood without difficulty with good hand placement    Ambulation/Gait Ambulation/Gait assistance: Supervision Gait Distance (Feet): 150 Feet Assistive device: Rolling  walker (2 wheels) Gait Pattern/deviations: Step-to pattern, Decreased weight shift to left Gait velocity: decreased     General Gait Details: Steady gait with step to L pattern; min cues for staying close to RW; no numbness today   Stairs Stairs: Yes Stairs assistance: Contact guard assist Stair Management: Step to pattern, Forwards, Backwards, With walker Number of Stairs: 3 General stair comments: Performed 6 platform step x 2 forward and x 1 backward.  Pt has wide step then threshold -similar to to platforms.  Able to perform forward and backward without difficulty   Wheelchair Mobility     Tilt Bed    Modified Rankin (Stroke Patients Only)       Balance Overall balance assessment: Needs assistance Sitting-balance support: No upper extremity supported Sitting balance-Leahy Scale: Good     Standing balance support: Bilateral upper extremity supported, No upper extremity supported Standing balance-Leahy Scale: Fair Standing balance comment: RW to ambulate - steady; could stand without UE support                            Communication    Cognition Arousal: Alert Behavior During Therapy: WFL for tasks assessed/performed   PT - Cognitive impairments: No apparent impairments                                Cueing    Exercises Total Joint Exercises Ankle Circles/Pumps: AROM, Both, 10 reps,  Supine Quad Sets: AROM, Both, 10 reps, Supine Heel Slides: AAROM, Left, 10 reps, Supine Hip ABduction/ADduction: AAROM, Left, 10 reps, Supine Long Arc Quad: Left, 10 reps, Seated, AAROM Knee Flexion: AAROM, Left, 10 reps, Seated Goniometric ROM: L knee 3 to 90 degrees    General Comments General comments (skin integrity, edema, etc.): numbness resolved and pt reports feeling much more steady  Educated on safe ice use, no pivots, car transfers, resting with leg straight, and TED hose during day. Also, encouraged walking every 1-2 hours during day.  Educated on HEP with focus on mobility the first weeks. Discussed doing exercises within pain control and if pain increasing could decreased ROM, reps, and stop exercises as needed. Encouraged to perform quad sets and ankle pumps frequently for blood flow and to promote full knee extension.      Pertinent Vitals/Pain Pain Assessment Pain Assessment: 0-10 Pain Score: 3  Pain Location: L TKA Pain Descriptors / Indicators: Discomfort Pain Intervention(s): Limited activity within patient's tolerance, Monitored during session, Premedicated before session, Repositioned, Ice applied    Home Living                          Prior Function            PT Goals (current goals can now be found in the care plan section) Progress towards PT goals: Progressing toward goals    Frequency    7X/week      PT Plan      Co-evaluation              AM-PAC PT 6 Clicks Mobility   Outcome Measure  Help needed turning from your back to your side while in a flat bed without using bedrails?: A Little Help needed moving from lying on your back to sitting on the side of a flat bed without using bedrails?: A Little Help needed moving to and from a bed to a chair (including a wheelchair)?: A Little Help needed standing up from a chair using your arms (e.g., wheelchair or bedside chair)?: A Little Help needed to walk in hospital room?: A Little Help needed climbing 3-5 steps with a railing? : A Little 6 Click Score: 18    End of Session Equipment Utilized During Treatment: Gait belt Activity Tolerance: Patient tolerated treatment well Patient left: with call bell/phone within reach;in chair Nurse Communication: Mobility status PT Visit Diagnosis: Other abnormalities of gait and mobility (R26.89);Muscle weakness (generalized) (M62.81)     Time: 8983-8950 PT Time Calculation (min) (ACUTE ONLY): 33 min  Charges:    $Gait Training: 8-22 mins $Therapeutic Exercise: 8-22 mins PT  General Charges $$ ACUTE PT VISIT: 1 Visit                     Benjiman, PT Acute Rehab [facility] [phone]    Benjiman VEAR Mulberry 06/28/2024, 12:23 PM

## 2024-06-28 NOTE — Progress Notes (Signed)
   Subjective: 1 Day Post-Op Procedure(s) (LRB): ARTHROPLASTY, KNEE, TOTAL (Left) Patient reports pain as mild.   Patient seen in rounds for Dr. Ernie. Patient is doing great this morning, reports minimal pain in the knee. Spinal took longer to wear off yesterday, preventing discharge. Denies chest pain, SOB, or calf pain. We will continue therapy today  Objective: Vital signs in last 24 hours: Temp:  [97.2 F (36.2 C)-98.1 F (36.7 C)] 98 F (36.7 C) (07/02 0711) Pulse Rate:  [45-91] 70 (07/02 0711) Resp:  [8-19] 17 (07/02 0711) BP: (116-145)/(57-90) 124/57 (07/02 0711) SpO2:  [94 %-100 %] 95 % (07/02 0711)  Intake/Output from previous day:  Intake/Output Summary (Last 24 hours) at 06/28/2024 0822 Last data filed at 06/28/2024 0300 Gross per 24 hour  Intake 1855.17 ml  Output 1200 ml  Net 655.17 ml     Intake/Output this shift: No intake/output data recorded.  Labs: Recent Labs    06/28/24 0327  HGB 10.4*   Recent Labs    06/28/24 0327  WBC 11.8*  RBC 3.68*  HCT 32.9*  PLT 208   Recent Labs    06/28/24 0327  NA 137  K 4.6  CL 108  CO2 23  BUN 20  CREATININE 1.11*  GLUCOSE 133*  CALCIUM 8.9   No results for input(s): LABPT, INR in the last 72 hours.  Exam: General - Patient is Alert and Oriented Extremity - Neurologically intact Neurovascular intact Sensation intact distally Dorsiflexion/Plantar flexion intact Dressing - dressing C/D/I Motor Function - intact, moving foot and toes well on exam.   Past Medical History:  Diagnosis Date   Arthritis    Chronic kidney disease    stage 3   Heart murmur    Hypertension    Stroke (HCC)     Assessment/Plan: 1 Day Post-Op Procedure(s) (LRB): ARTHROPLASTY, KNEE, TOTAL (Left) Principal Problem:   S/P total knee replacement, left Active Problems:   S/P total knee arthroplasty, left  Estimated body mass index is 31 kg/m as calculated from the following:   Height as of this encounter: 5' 9.5  (1.765 m).   Weight as of this encounter: 96.6 kg. Up with therapy  DVT Prophylaxis - Aspirin Weight bearing as tolerated. Continue therapy.  Plan is to go Home after hospital stay. Plan for discharge later today if progresses with therapy and meeting goals. Scheduled for OPPT in [place] Follow-up in the office in 2 weeks.  Roxie Mess, PA-C Orthopedic Surgery [phone] 06/28/2024, 8:22 AM

## 2024-06-28 NOTE — Progress Notes (Signed)
 AVS reviewed with patient at the bedside. All questions answered, and patient verbalized understanding. IV removed without complications. Patient to be discharged home via vehicle. Patient escorted to main entrance via WC by staff.

## 2024-07-03 ENCOUNTER — Other Ambulatory Visit: Payer: Self-pay

## 2024-07-03 ENCOUNTER — Encounter: Payer: Self-pay | Admitting: Physical Therapy

## 2024-07-03 ENCOUNTER — Ambulatory Visit: Attending: Student | Admitting: Physical Therapy

## 2024-07-03 DIAGNOSIS — M62838 Other muscle spasm: Secondary | ICD-10-CM | POA: Diagnosis present

## 2024-07-03 DIAGNOSIS — M25562 Pain in left knee: Secondary | ICD-10-CM | POA: Diagnosis present

## 2024-07-03 DIAGNOSIS — M25662 Stiffness of left knee, not elsewhere classified: Secondary | ICD-10-CM | POA: Diagnosis present

## 2024-07-03 DIAGNOSIS — R2689 Other abnormalities of gait and mobility: Secondary | ICD-10-CM | POA: Insufficient documentation

## 2024-07-03 DIAGNOSIS — R600 Localized edema: Secondary | ICD-10-CM | POA: Diagnosis present

## 2024-07-03 DIAGNOSIS — M6281 Muscle weakness (generalized): Secondary | ICD-10-CM | POA: Diagnosis present

## 2024-07-03 DIAGNOSIS — G8929 Other chronic pain: Secondary | ICD-10-CM | POA: Diagnosis present

## 2024-07-03 NOTE — Therapy (Signed)
 OUTPATIENT PHYSICAL THERAPY LOWER EXTREMITY EVALUATION   Patient Name: Whitney Hess MRN: [id number] DOB:06/10/1951, 73 y.o., female Today's Date: 07/03/2024  END OF SESSION:  PT End of Session - 07/03/24 1442     Visit Number 1    Number of Visits 20    Date for PT Re-Evaluation 09/11/24    Authorization Type [employer]    Progress Note Due on Visit 10    PT Start Time 1445    PT Stop Time 1525    PT Time Calculation (min) 40 min          Past Medical History:  Diagnosis Date   Arthritis    Chronic kidney disease    stage 3   Heart murmur    Hypertension    Stroke [facility])    Past Surgical History:  Procedure Laterality Date   ABDOMINAL HYSTERECTOMY     ANTERIOR CERVICAL DECOMP/DISCECTOMY FUSION     ANTERIOR CRUCIATE LIGAMENT REPAIR Left    ARTHROPLASTY Bilateral    second toe   BACK SURGERY     BRAIN SURGERY     BUNIONECTOMY Bilateral    TOTAL KNEE ARTHROPLASTY Left 06/27/2024   Procedure: ARTHROPLASTY, KNEE, TOTAL;  Surgeon: Ernie Cough, MD;  Location: [facility];  Service: Orthopedics;  Laterality: Left;   Patient Active Problem List   Diagnosis Date Noted   S/P total knee arthroplasty, left 06/27/2024   S/P total knee replacement, left 06/27/2024    PCP: Andrew Truman GRADE., MD  REFERRING PROVIDER: Patti Rosina SAUNDERS, PA-C  REFERRING DIAG: [phone] (ICD-10-CM) - Unilateral primary osteoarthritis, left knee  THERAPY DIAG:  Chronic pain of left knee  Stiffness of left knee, not elsewhere classified  Muscle weakness (generalized)  Other muscle spasm  Localized edema  Other abnormalities of gait and mobility  Rationale for Evaluation and Treatment: Rehabilitation  ONSET DATE: 06/27/24 L TKA  SUBJECTIVE:   SUBJECTIVE STATEMENT: Pt is now back at home and everything has been okay. I've been getting around real good. I don't always use the walker. Pt states her balance has been good. Has been icing as much as she can. Has been walking for  exercise at the house at least every hour. Has been keeping up with her pain medication every 4 hours.   PERTINENT HISTORY: Back fusion (reports chronic foot drop), ACDF   PAIN:  Are you having pain? Yes: NPRS scale: currently 0/10, at worst 10/10 Pain location: L front of knee Pain description: Sharp Aggravating factors: Standing prolonged time Relieving factors: Ice, pain medication  PRECAUTIONS: None  RED FLAGS: None   WEIGHT BEARING RESTRICTIONS: No  FALLS:  Has patient fallen in last 6 months? No  LIVING ENVIRONMENT: Lives with: lives with their spouse Lives in: House/apartment Stairs: 2 story house with basement but doesn't have to do the steps Has following equipment at home: Vannie - 2 wheeled  OCCUPATION: Retired  PLOF: Independent  PATIENT GOALS: Improve knee strength and mobility  NEXT MD VISIT: 2 week f/u  OBJECTIVE:  Note: Objective measures were completed at Evaluation unless otherwise noted.  DIAGNOSTIC FINDINGS: n/a  PATIENT SURVEYS:  LEFS  Extreme difficulty/unable (0), Quite a bit of difficulty (1), Moderate difficulty (2), Little difficulty (3), No difficulty (4) Survey date:  07/03/24  Any of your usual work, housework or school activities 1  2. Usual hobbies, recreational or sporting activities 1  3. Getting into/out of the bath 3  4. Walking between rooms 3  5. Putting on socks/shoes  3  6. Squatting  0  7. Lifting an object, like a bag of groceries from the floor 0  8. Performing light activities around your home 1  9. Performing heavy activities around your home 0  10. Getting into/out of a car 2  11. Walking 2 blocks 0  12. Walking 1 mile 0  13. Going up/down 10 stairs (1 flight) 0  14. Standing for 1 hour 0  15.  sitting for 1 hour 1  16. Running on even ground 0  17. Running on uneven ground 0  18. Making sharp turns while running fast 0  19. Hopping  0  20. Rolling over in bed 0  Score total:  15/80     COGNITION: Overall  cognitive status: Within functional limits for tasks assessed     SENSATION: WFL  EDEMA:  40 cm on R, 44 cm on L circumferential  MUSCLE LENGTH: Did not assess  POSTURE: No Significant postural limitations and forward head  PALPATION: TTP L quad and anterior thigh  LOWER EXTREMITY ROM:   ROM Right eval Left eval  Hip flexion    Hip extension    Hip abduction    Hip adduction    Hip internal rotation    Hip external rotation    Knee flexion 115 AROM 100 AROM 108 AAROM   Knee extension -5 LAQ -40 LAQ -12 PROM  Ankle dorsiflexion    Ankle plantarflexion    Ankle inversion    Ankle eversion     (Blank rows = not tested)  LOWER EXTREMITY MMT:  MMT Right eval Left eval  Hip flexion 5 3-  Hip extension    Hip abduction    Hip adduction    Hip internal rotation    Hip external rotation    Knee flexion 5 4  Knee extension 5 3-  Ankle dorsiflexion    Ankle plantarflexion    Ankle inversion    Ankle eversion     (Blank rows = not tested)  LOWER EXTREMITY SPECIAL TESTS:  Did not assess  FUNCTIONAL TESTS:  5 times sit to stand: 11.22 sec with UE use/support TUG: 23.65 sec  GAIT: Distance walked: Into clinic Assistive device utilized: Walker - 2 wheeled Level of assistance: Modified independence Comments: Flexed trunk, slightly antalgic                                                                                                                                TREATMENT DATE: 07/03/24 See HEP below    PATIENT EDUCATION:  Education details: Exam findings, POC, initial HEP, RICE method for edema management Person educated: Patient Education method: Explanation, Demonstration, and Handouts Education comprehension: verbalized understanding, returned demonstration, and needs further education  HOME EXERCISE PROGRAM: Access Code: [email or web address] URL: [email or web address] Date: 07/03/2024 Prepared by: Mihir Flanigan April Marie Teiara Baria  Exercises -  Supine Heel Slide with Strap  - 2 x daily - 7 x weekly -  1 sets - 10 reps - Supine Quad Set on Towel Roll  - 2-3 x daily - 7 x weekly - 2 sets - 10 reps - Heel Toe Raises with Counter Support  - 1 x daily - 7 x weekly - 2 sets - 10 reps - Standing Hip Abduction with Counter Support  - 1 x daily - 7 x weekly - 2 sets - 10 reps - Standing Hip Extension with Counter Support  - 1 x daily - 7 x weekly - 2 sets - 10 reps - Standing Hamstring Curl with Chair Support  - 1 x daily - 7 x weekly - 2 sets - 10 reps  ASSESSMENT:  CLINICAL IMPRESSION: Patient is a 73 y.o. F who was seen today for physical therapy evaluation and treatment for L TKA on 06/27/24. Assessment is significant for reduced L knee ROM, strength, and stability affecting transfers and amb for home and community mobility. Pt will benefit from PT to address these deficits to maximize her level of function.   OBJECTIVE IMPAIRMENTS: Abnormal gait, decreased activity tolerance, decreased balance, decreased coordination, decreased endurance, decreased mobility, difficulty walking, decreased ROM, decreased strength, hypomobility, increased edema, increased fascial restrictions, increased muscle spasms, improper body mechanics, postural dysfunction, and pain.   ACTIVITY LIMITATIONS: carrying, lifting, bending, sitting, standing, squatting, sleeping, stairs, transfers, bed mobility, bathing, toileting, dressing, hygiene/grooming, locomotion level, and caring for others  PARTICIPATION LIMITATIONS: meal prep, cleaning, laundry, driving, shopping, community activity, and yard work  PERSONAL FACTORS: Age, Fitness, Past/current experiences, and Time since onset of injury/illness/exacerbation are also affecting patient's functional outcome.   REHAB POTENTIAL: Good  CLINICAL DECISION MAKING: Evolving/moderate complexity  EVALUATION COMPLEXITY: Moderate   GOALS: Goals reviewed with patient? Yes  SHORT TERM GOALS: Target date: 08/07/2024  Pt will  be ind with initial HEP Baseline: Goal status: INITIAL  2.  Pt will demo L knee ROM from 5 to 115 deg Baseline:  Goal status: INITIAL  3.  Pt will report reduction in pain by >/=50% Baseline:  Goal status: INITIAL  LONG TERM GOALS: Target date: 09/11/2024   Pt will be ind with management and progression of HEP Baseline:  Goal status: INITIAL  2.  Pt will demo L knee ROM from 0 to at least 115 deg for stair negotiation Baseline:  Goal status: INITIAL  3.  Pt will be able to ascend/descend steps in a reciprocal fashion to demo increased quad control and strength Baseline:  Goal status: INITIAL  4.  Pt will have improved TUG to </=13 sec without a/d Baseline:  Goal status: INITIAL  5.  Pt will be able to amb >/=1000' independently for community mobility Baseline:  Goal status: INITIAL  6.  Pt will have improved LEFS to >/=24/80 to demo MCID Baseline: 15 Goal status: INITIAL   PLAN:  PT FREQUENCY: 2x/week  PT DURATION: 10 weeks  PLANNED INTERVENTIONS: 97164- PT Re-evaluation, 97750- Physical Performance Testing, 97110-Therapeutic exercises, 97530- Therapeutic activity, 97112- Neuromuscular re-education, 97535- Self Care, 02859- Manual therapy, [id number]- Gait training, [phone]- Aquatic Therapy, [phone]- Electrical stimulation (unattended), 97016- Vasopneumatic device, [id number]- Ultrasound, [id number]- Ionotophoresis 4mg /ml Dexamethasone , 79439 (1-2 muscles), 20561 (3+ muscles)- Dry Needling, Patient/Family education, Balance training, Stair training, Taping, Joint mobilization, Spinal mobilization, Cryotherapy, and Moist heat  PLAN FOR NEXT SESSION: Assess response to HEP. 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 Gait training. Knee ROM.    Jeury Mcnab April Ma L Maxum Cassarino, PT, DPT 07/03/2024, 4:23 PM

## 2024-07-05 ENCOUNTER — Ambulatory Visit: Admitting: Physical Therapy

## 2024-07-05 DIAGNOSIS — R600 Localized edema: Secondary | ICD-10-CM

## 2024-07-05 DIAGNOSIS — M6281 Muscle weakness (generalized): Secondary | ICD-10-CM

## 2024-07-05 DIAGNOSIS — M25662 Stiffness of left knee, not elsewhere classified: Secondary | ICD-10-CM

## 2024-07-05 DIAGNOSIS — R2689 Other abnormalities of gait and mobility: Secondary | ICD-10-CM

## 2024-07-05 DIAGNOSIS — M62838 Other muscle spasm: Secondary | ICD-10-CM

## 2024-07-05 DIAGNOSIS — G8929 Other chronic pain: Secondary | ICD-10-CM

## 2024-07-05 DIAGNOSIS — M25562 Pain in left knee: Secondary | ICD-10-CM | POA: Diagnosis not present

## 2024-07-05 NOTE — Therapy (Signed)
 OUTPATIENT PHYSICAL THERAPY LOWER EXTREMITY EVALUATION   Patient Name: Whitney Hess MRN: [id number] DOB:06/07/1951, 73 y.o., female Today's Date: 07/05/2024  END OF SESSION:  PT End of Session - 07/05/24 1400     Visit Number 2    Number of Visits 20    Date for PT Re-Evaluation 09/11/24    Authorization Type [employer]    Progress Note Due on Visit 10    PT Start Time 1400    PT Stop Time 1440    PT Time Calculation (min) 40 min          Past Medical History:  Diagnosis Date   Arthritis    Chronic kidney disease    stage 3   Heart murmur    Hypertension    Stroke [facility])    Past Surgical History:  Procedure Laterality Date   ABDOMINAL HYSTERECTOMY     ANTERIOR CERVICAL DECOMP/DISCECTOMY FUSION     ANTERIOR CRUCIATE LIGAMENT REPAIR Left    ARTHROPLASTY Bilateral    second toe   BACK SURGERY     BRAIN SURGERY     BUNIONECTOMY Bilateral    TOTAL KNEE ARTHROPLASTY Left 06/27/2024   Procedure: ARTHROPLASTY, KNEE, TOTAL;  Surgeon: Ernie Cough, MD;  Location: [facility];  Service: Orthopedics;  Laterality: Left;   Patient Active Problem List   Diagnosis Date Noted   S/P total knee arthroplasty, left 06/27/2024   S/P total knee replacement, left 06/27/2024    PCP: Andrew Truman GRADE., MD  REFERRING PROVIDER: Patti Rosina SAUNDERS, PA-C  REFERRING DIAG: [phone] (ICD-10-CM) - Unilateral primary osteoarthritis, left knee  THERAPY DIAG:  Chronic pain of left knee  Stiffness of left knee, not elsewhere classified  Muscle weakness (generalized)  Other muscle spasm  Localized edema  Other abnormalities of gait and mobility  Rationale for Evaluation and Treatment: Rehabilitation  ONSET DATE: 06/27/24 L TKA  SUBJECTIVE:   SUBJECTIVE STATEMENT: Pt states she has been through all the exercises.   From eval: Pt is now back at home and everything has been okay. I've been getting around real good. I don't always use the walker. Pt states her balance has been  good. Has been icing as much as she can. Has been walking for exercise at the house at least every hour. Has been keeping up with her pain medication every 4 hours.   PERTINENT HISTORY: Back fusion (reports chronic foot drop), ACDF   PAIN:  Are you having pain? Yes: NPRS scale: currently 4/10, at worst 10/10 Pain location: L front of knee Pain description: Sharp Aggravating factors: Standing prolonged time Relieving factors: Ice, pain medication  PRECAUTIONS: None  RED FLAGS: None   WEIGHT BEARING RESTRICTIONS: No  FALLS:  Has patient fallen in last 6 months? No  LIVING ENVIRONMENT: Lives with: lives with their spouse Lives in: House/apartment Stairs: 2 story house with basement but doesn't have to do the steps Has following equipment at home: Vannie - 2 wheeled  OCCUPATION: Retired  PLOF: Independent  PATIENT GOALS: Improve knee strength and mobility  NEXT MD VISIT: 2 week f/u  OBJECTIVE:  Note: Objective measures were completed at Evaluation unless otherwise noted.  DIAGNOSTIC FINDINGS: n/a  PATIENT SURVEYS:  LEFS  Extreme difficulty/unable (0), Quite a bit of difficulty (1), Moderate difficulty (2), Little difficulty (3), No difficulty (4) Survey date:  07/03/24  Any of your usual work, housework or school activities 1  2. Usual hobbies, recreational or sporting activities 1  3. Getting into/out of the  bath 3  4. Walking between rooms 3  5. Putting on socks/shoes 3  6. Squatting  0  7. Lifting an object, like a bag of groceries from the floor 0  8. Performing light activities around your home 1  9. Performing heavy activities around your home 0  10. Getting into/out of a car 2  11. Walking 2 blocks 0  12. Walking 1 mile 0  13. Going up/down 10 stairs (1 flight) 0  14. Standing for 1 hour 0  15.  sitting for 1 hour 1  16. Running on even ground 0  17. Running on uneven ground 0  18. Making sharp turns while running fast 0  19. Hopping  0  20. Rolling  over in bed 0  Score total:  15/80     COGNITION: Overall cognitive status: Within functional limits for tasks assessed     SENSATION: WFL  EDEMA:  40 cm on R, 44 cm on L circumferential  MUSCLE LENGTH: Did not assess  POSTURE: No Significant postural limitations and forward head  PALPATION: TTP L quad and anterior thigh  LOWER EXTREMITY ROM:   ROM Right eval Left eval  Hip flexion    Hip extension    Hip abduction    Hip adduction    Hip internal rotation    Hip external rotation    Knee flexion 115 AROM 100 AROM 108 AAROM   Knee extension -5 LAQ -40 LAQ -12 PROM  Ankle dorsiflexion    Ankle plantarflexion    Ankle inversion    Ankle eversion     (Blank rows = not tested)  LOWER EXTREMITY MMT:  MMT Right eval Left eval  Hip flexion 5 3-  Hip extension    Hip abduction    Hip adduction    Hip internal rotation    Hip external rotation    Knee flexion 5 4  Knee extension 5 3-  Ankle dorsiflexion    Ankle plantarflexion    Ankle inversion    Ankle eversion     (Blank rows = not tested)  LOWER EXTREMITY SPECIAL TESTS:  Did not assess  FUNCTIONAL TESTS:  5 times sit to stand: 11.22 sec with UE use/support TUG: 23.65 sec  GAIT: Distance walked: Into clinic Assistive device utilized: Environmental consultant - 2 wheeled Level of assistance: Modified independence Comments: Flexed trunk, slightly antalgic                                                                                                                                TREATMENT DATE:  07/05/24 Nustep L6 x 8 min UEs/LEs Seated hamstring stretch x 30 Seated quad set 2x10 Seated SLR with hip abd x10 each knees straight and then bent Seated LAQ AAROM x10 Seated hamstring curl AAROM x10 Standing heel/toe raise x30 Standing hip abd 2x10 red TB Standing hip ext 2x10 red TB Gait training: 180' no a/d, working on hip extension  and equal step length to decrease antalgic gait pattern. Able to perform with  slowed gait speed   07/03/24 See HEP below    PATIENT EDUCATION:  Education details: HEP Person educated: Patient Education method: Explanation, Demonstration, and Handouts Education comprehension: verbalized understanding, returned demonstration, and needs further education  HOME EXERCISE PROGRAM: Access Code: [email or web address] URL: [email or web address] Date: 07/03/2024 Prepared by: Leeyah Heather April Earnie Starring  Exercises - Supine Heel Slide with Strap  - 2 x daily - 7 x weekly - 1 sets - 10 reps - Supine Quad Set on Towel Roll  - 2-3 x daily - 7 x weekly - 2 sets - 10 reps - Heel Toe Raises with Counter Support  - 1 x daily - 7 x weekly - 2 sets - 10 reps - Standing Hip Abduction with Counter Support  - 1 x daily - 7 x weekly - 2 sets - 10 reps - Standing Hip Extension with Counter Support  - 1 x daily - 7 x weekly - 2 sets - 10 reps - Standing Hamstring Curl with Chair Support  - 1 x daily - 7 x weekly - 2 sets - 10 reps  ASSESSMENT:  CLINICAL IMPRESSION: Treatment session focused on improving quad activation. Can see more activity with quad setting today. LAQ remains limited as well as hip flexion. Reviewed and progressed standing HEP. Worked on gait quality today to reduce her antalgia.   From eval: Patient is a 73 y.o. F who was seen today for physical therapy evaluation and treatment for L TKA on 06/27/24. Assessment is significant for reduced L knee ROM, strength, and stability affecting transfers and amb for home and community mobility. Pt will benefit from PT to address these deficits to maximize her level of function.   OBJECTIVE IMPAIRMENTS: Abnormal gait, decreased activity tolerance, decreased balance, decreased coordination, decreased endurance, decreased mobility, difficulty walking, decreased ROM, decreased strength, hypomobility, increased edema, increased fascial restrictions, increased muscle spasms, improper body mechanics, postural dysfunction, and pain.    ACTIVITY LIMITATIONS: carrying, lifting, bending, sitting, standing, squatting, sleeping, stairs, transfers, bed mobility, bathing, toileting, dressing, hygiene/grooming, locomotion level, and caring for others  PARTICIPATION LIMITATIONS: meal prep, cleaning, laundry, driving, shopping, community activity, and yard work  PERSONAL FACTORS: Age, Fitness, Past/current experiences, and Time since onset of injury/illness/exacerbation are also affecting patient's functional outcome.   REHAB POTENTIAL: Good  CLINICAL DECISION MAKING: Evolving/moderate complexity  EVALUATION COMPLEXITY: Moderate   GOALS: Goals reviewed with patient? Yes  SHORT TERM GOALS: Target date: 08/07/2024  Pt will be ind with initial HEP Baseline: Goal status: INITIAL  2.  Pt will demo L knee ROM from 5 to 115 deg Baseline:  Goal status: INITIAL  3.  Pt will report reduction in pain by >/=50% Baseline:  Goal status: INITIAL  LONG TERM GOALS: Target date: 09/11/2024   Pt will be ind with management and progression of HEP Baseline:  Goal status: INITIAL  2.  Pt will demo L knee ROM from 0 to at least 115 deg for stair negotiation Baseline:  Goal status: INITIAL  3.  Pt will be able to ascend/descend steps in a reciprocal fashion to demo increased quad control and strength Baseline:  Goal status: INITIAL  4.  Pt will have improved TUG to </=13 sec without a/d Baseline:  Goal status: INITIAL  5.  Pt will be able to amb >/=1000' independently for community mobility Baseline:  Goal status: INITIAL  6.  Pt will have improved LEFS to >/=24/80 to  demo MCID Baseline: 15 Goal status: INITIAL   PLAN:  PT FREQUENCY: 2x/week  PT DURATION: 10 weeks  PLANNED INTERVENTIONS: 97164- PT Re-evaluation, 97750- Physical Performance Testing, 97110-Therapeutic exercises, 97530- Therapeutic activity, [id number]- Neuromuscular re-education, 97535- Self Care, 02859- Manual therapy, [phone]- Gait training, [phone]- Aquatic  Therapy, [phone]- Electrical stimulation (unattended), 97016- Vasopneumatic device, [id number]- Ultrasound, [id number]- Ionotophoresis 4mg /ml Dexamethasone , 79439 (1-2 muscles), 20561 (3+ muscles)- Dry Needling, Patient/Family education, Balance training, Stair training, Taping, Joint mobilization, Spinal mobilization, Cryotherapy, and Moist heat  PLAN FOR NEXT SESSION: Assess response to HEP. 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 Gait training. Knee ROM.    Adell Panek April Ma L Cricket Goodlin, PT, DPT 07/05/2024, 2:01 PM

## 2024-07-07 ENCOUNTER — Ambulatory Visit

## 2024-07-07 DIAGNOSIS — R600 Localized edema: Secondary | ICD-10-CM

## 2024-07-07 DIAGNOSIS — M62838 Other muscle spasm: Secondary | ICD-10-CM

## 2024-07-07 DIAGNOSIS — M25562 Pain in left knee: Secondary | ICD-10-CM | POA: Diagnosis not present

## 2024-07-07 DIAGNOSIS — M25662 Stiffness of left knee, not elsewhere classified: Secondary | ICD-10-CM

## 2024-07-07 DIAGNOSIS — M6281 Muscle weakness (generalized): Secondary | ICD-10-CM

## 2024-07-07 DIAGNOSIS — G8929 Other chronic pain: Secondary | ICD-10-CM

## 2024-07-07 DIAGNOSIS — R2689 Other abnormalities of gait and mobility: Secondary | ICD-10-CM

## 2024-07-07 NOTE — Therapy (Signed)
 OUTPATIENT PHYSICAL THERAPY LOWER EXTREMITY TREATMENT   Patient Name: Whitney Hess MRN: [id number] DOB:1951/04/02, 73 y.o., female Today's Date: 07/07/2024  END OF SESSION:  PT End of Session - 07/07/24 1121     Visit Number 3    Number of Visits 20    Date for PT Re-Evaluation 09/11/24    Authorization Type [employer]    Progress Note Due on Visit 10    PT Start Time 1025    PT Stop Time 1115    PT Time Calculation (min) 50 min    Activity Tolerance Patient tolerated treatment well    Behavior During Therapy WFL for tasks assessed/performed           Past Medical History:  Diagnosis Date   Arthritis    Chronic kidney disease    stage 3   Heart murmur    Hypertension    Stroke [facility])    Past Surgical History:  Procedure Laterality Date   ABDOMINAL HYSTERECTOMY     ANTERIOR CERVICAL DECOMP/DISCECTOMY FUSION     ANTERIOR CRUCIATE LIGAMENT REPAIR Left    ARTHROPLASTY Bilateral    second toe   BACK SURGERY     BRAIN SURGERY     BUNIONECTOMY Bilateral    TOTAL KNEE ARTHROPLASTY Left 06/27/2024   Procedure: ARTHROPLASTY, KNEE, TOTAL;  Surgeon: Ernie Cough, MD;  Location: [facility];  Service: Orthopedics;  Laterality: Left;   Patient Active Problem List   Diagnosis Date Noted   S/P total knee arthroplasty, left 06/27/2024   S/P total knee replacement, left 06/27/2024    PCP: Andrew Truman GRADE., MD  REFERRING PROVIDER: Patti Rosina SAUNDERS, PA-C  REFERRING DIAG: [phone] (ICD-10-CM) - Unilateral primary osteoarthritis, left knee  THERAPY DIAG:  Chronic pain of left knee  Stiffness of left knee, not elsewhere classified  Muscle weakness (generalized)  Other muscle spasm  Localized edema  Other abnormalities of gait and mobility  Rationale for Evaluation and Treatment: Rehabilitation  ONSET DATE: 06/27/24 L TKA  SUBJECTIVE:   SUBJECTIVE STATEMENT: No pain in the knee, just tight, not sleeping great at night  From eval: Pt is now back at home and  everything has been okay. I've been getting around real good. I don't always use the walker. Pt states her balance has been good. Has been icing as much as she can. Has been walking for exercise at the house at least every hour. Has been keeping up with her pain medication every 4 hours.   PERTINENT HISTORY: Back fusion (reports chronic foot drop), ACDF   PAIN:  Are you having pain? Yes: NPRS scale: 0/10 Pain location: L knee Pain description: tight Aggravating factors: Standing prolonged time Relieving factors: Ice, pain medication  PRECAUTIONS: None  RED FLAGS: None   WEIGHT BEARING RESTRICTIONS: No  FALLS:  Has patient fallen in last 6 months? No  LIVING ENVIRONMENT: Lives with: lives with their spouse Lives in: House/apartment Stairs: 2 story house with basement but doesn't have to do the steps Has following equipment at home: Vannie - 2 wheeled  OCCUPATION: Retired  PLOF: Independent  PATIENT GOALS: Improve knee strength and mobility  NEXT MD VISIT: 2 week f/u  OBJECTIVE:  Note: Objective measures were completed at Evaluation unless otherwise noted.  DIAGNOSTIC FINDINGS: n/a  PATIENT SURVEYS:  LEFS  Extreme difficulty/unable (0), Quite a bit of difficulty (1), Moderate difficulty (2), Little difficulty (3), No difficulty (4) Survey date:  07/03/24  Any of your usual work, housework or school activities  1  2. Usual hobbies, recreational or sporting activities 1  3. Getting into/out of the bath 3  4. Walking between rooms 3  5. Putting on socks/shoes 3  6. Squatting  0  7. Lifting an object, like a bag of groceries from the floor 0  8. Performing light activities around your home 1  9. Performing heavy activities around your home 0  10. Getting into/out of a car 2  11. Walking 2 blocks 0  12. Walking 1 mile 0  13. Going up/down 10 stairs (1 flight) 0  14. Standing for 1 hour 0  15.  sitting for 1 hour 1  16. Running on even ground 0  17. Running on  uneven ground 0  18. Making sharp turns while running fast 0  19. Hopping  0  20. Rolling over in bed 0  Score total:  15/80     COGNITION: Overall cognitive status: Within functional limits for tasks assessed     SENSATION: WFL  EDEMA:  40 cm on R, 44 cm on L circumferential  MUSCLE LENGTH: Did not assess  POSTURE: No Significant postural limitations and forward head  PALPATION: TTP L quad and anterior thigh  LOWER EXTREMITY ROM:   ROM Right eval Left eval L 07/07/24  Hip flexion     Hip extension     Hip abduction     Hip adduction     Hip internal rotation     Hip external rotation     Knee flexion 115 AROM 100 AROM 108 AAROM  105 - seated  Knee extension -5 LAQ -40 LAQ -12 PROM 20 LAQ  Ankle dorsiflexion     Ankle plantarflexion     Ankle inversion     Ankle eversion      (Blank rows = not tested)  LOWER EXTREMITY MMT:  MMT Right eval Left eval  Hip flexion 5 3-  Hip extension    Hip abduction    Hip adduction    Hip internal rotation    Hip external rotation    Knee flexion 5 4  Knee extension 5 3-  Ankle dorsiflexion    Ankle plantarflexion    Ankle inversion    Ankle eversion     (Blank rows = not tested)  LOWER EXTREMITY SPECIAL TESTS:  Did not assess  FUNCTIONAL TESTS:  5 times sit to stand: 11.22 sec with UE use/support TUG: 23.65 sec  GAIT: Distance walked: Into clinic Assistive device utilized: Walker - 2 wheeled Level of assistance: Modified independence Comments: Flexed trunk, slightly antalgic                                                                                                                                TREATMENT DATE:  07/07/24 NEUROMUSCULAR RE-EDUCATION: To improve  kinesthesia and proprioception.  Retro step x 10 with cane Knee flexion lunge stretch x 20 4' Standing heel/toe raises 2 x 10  B Mini squats x 10 SLR + QS x 5 QS pushing into therapist hand- cues to avoid using glutes and curling  toes  THERAPEUTIC EXERCISE: To improve strength, endurance, and ROM  Nustep L6x75min UE/LE Seated L SAQ 2x10 with assistance Seated lumbar flexion stretch green pball x1 min  GAIT TRAINING: To normalize gait pattern and improve safety.  With [facility] cues to increase stride length, heel/toe pattern, adjusted AD a notch lower  MANUAL THERAPY: To promote normalized muscle tension and for pain modulation  STM to R lumbar paraspinals 07/05/24 Nustep L6 x 8 min UEs/LEs Seated hamstring stretch x 30 Seated quad set 2x10 Seated SLR with hip abd x10 each knees straight and then bent Seated LAQ AAROM x10 Seated hamstring curl AAROM x10 Standing heel/toe raise x30 Standing hip abd 2x10 red TB Standing hip ext 2x10 red TB Gait training: 180' no a/d, working on hip extension and equal step length to decrease antalgic gait pattern. Able to perform with slowed gait speed   07/03/24 See HEP below    PATIENT EDUCATION:  Education details: HEP Person educated: Patient Education method: Explanation, Demonstration, and Handouts Education comprehension: verbalized understanding, returned demonstration, and needs further education  HOME EXERCISE PROGRAM: Access Code: [email or web address] URL: [email or web address] Date: 07/03/2024 Prepared by: Gellen April Earnie Starring  Exercises - Supine Heel Slide with Strap  - 2 x daily - 7 x weekly - 1 sets - 10 reps - Supine Quad Set on Towel Roll  - 2-3 x daily - 7 x weekly - 2 sets - 10 reps - Heel Toe Raises with Counter Support  - 1 x daily - 7 x weekly - 2 sets - 10 reps - Standing Hip Abduction with Counter Support  - 1 x daily - 7 x weekly - 2 sets - 10 reps - Standing Hip Extension with Counter Support  - 1 x daily - 7 x weekly - 2 sets - 10 reps - Standing Hamstring Curl with Chair Support  - 1 x daily - 7 x weekly - 2 sets - 10 reps  ASSESSMENT:  CLINICAL IMPRESSION: Pt responded well to treatment, despite having a muscle spasm in R lower back  while performing quad sets. After STM and stretching the spasm went away. I adjusted the [facility] for her just a notch lower, for comfort. Cues required with QS to fully engage quads (very weak trace muscle contraction). AROM improving well thus far. Pt declined vaso today.  From eval: Patient is a 73 y.o. F who was seen today for physical therapy evaluation and treatment for L TKA on 06/27/24. Assessment is significant for reduced L knee ROM, strength, and stability affecting transfers and amb for home and community mobility. Pt will benefit from PT to address these deficits to maximize her level of function.   OBJECTIVE IMPAIRMENTS: Abnormal gait, decreased activity tolerance, decreased balance, decreased coordination, decreased endurance, decreased mobility, difficulty walking, decreased ROM, decreased strength, hypomobility, increased edema, increased fascial restrictions, increased muscle spasms, improper body mechanics, postural dysfunction, and pain.   ACTIVITY LIMITATIONS: carrying, lifting, bending, sitting, standing, squatting, sleeping, stairs, transfers, bed mobility, bathing, toileting, dressing, hygiene/grooming, locomotion level, and caring for others  PARTICIPATION LIMITATIONS: meal prep, cleaning, laundry, driving, shopping, community activity, and yard work  PERSONAL FACTORS: Age, Fitness, Past/current experiences, and Time since onset of injury/illness/exacerbation are also affecting patient's functional outcome.   REHAB POTENTIAL: Good  CLINICAL DECISION MAKING: Evolving/moderate complexity  EVALUATION COMPLEXITY: Moderate   GOALS: Goals reviewed with patient?  Yes  SHORT TERM GOALS: Target date: 08/07/2024  Pt will be ind with initial HEP Baseline: Goal status: INITIAL  2.  Pt will demo L knee ROM from 5 to 115 deg Baseline:  Goal status: 07/07/24- see AROM chart  3.  Pt will report reduction in pain by >/=50% Baseline:  Goal status: INITIAL  LONG TERM GOALS: Target  date: 09/11/2024   Pt will be ind with management and progression of HEP Baseline:  Goal status: INITIAL  2.  Pt will demo L knee ROM from 0 to at least 115 deg for stair negotiation Baseline:  Goal status: INITIAL  3.  Pt will be able to ascend/descend steps in a reciprocal fashion to demo increased quad control and strength Baseline:  Goal status: INITIAL  4.  Pt will have improved TUG to </=13 sec without a/d Baseline:  Goal status: INITIAL  5.  Pt will be able to amb >/=1000' independently for community mobility Baseline:  Goal status: INITIAL  6.  Pt will have improved LEFS to >/=24/80 to demo MCID Baseline: 15 Goal status: INITIAL   PLAN:  PT FREQUENCY: 2x/week  PT DURATION: 10 weeks  PLANNED INTERVENTIONS: 97164- PT Re-evaluation, 97750- Physical Performance Testing, 97110-Therapeutic exercises, 97530- Therapeutic activity, [id number]- Neuromuscular re-education, 97535- Self Care, 02859- Manual therapy, [id number]- Gait training, [phone]- Aquatic Therapy, [phone]- Electrical stimulation (unattended), 97016- Vasopneumatic device, [id number]- Ultrasound, [id number]- Ionotophoresis 4mg /ml Dexamethasone , 79439 (1-2 muscles), 20561 (3+ muscles)- Dry Needling, Patient/Family education, Balance training, Stair training, Taping, Joint mobilization, Spinal mobilization, Cryotherapy, and Moist heat  PLAN FOR NEXT SESSION: Work on Retail buyer. Knee ROM.    Yaretsi Humphres L Gwenetta Devos, PTA 07/07/2024, 11:21 AM

## 2024-07-10 ENCOUNTER — Ambulatory Visit

## 2024-07-10 DIAGNOSIS — M62838 Other muscle spasm: Secondary | ICD-10-CM

## 2024-07-10 DIAGNOSIS — R600 Localized edema: Secondary | ICD-10-CM

## 2024-07-10 DIAGNOSIS — G8929 Other chronic pain: Secondary | ICD-10-CM

## 2024-07-10 DIAGNOSIS — M25662 Stiffness of left knee, not elsewhere classified: Secondary | ICD-10-CM

## 2024-07-10 DIAGNOSIS — M25562 Pain in left knee: Secondary | ICD-10-CM | POA: Diagnosis not present

## 2024-07-10 DIAGNOSIS — M6281 Muscle weakness (generalized): Secondary | ICD-10-CM

## 2024-07-10 DIAGNOSIS — R2689 Other abnormalities of gait and mobility: Secondary | ICD-10-CM

## 2024-07-10 NOTE — Therapy (Signed)
 OUTPATIENT PHYSICAL THERAPY LOWER EXTREMITY TREATMENT   Patient Name: Neesha Langton MRN: [id number] DOB:[birth year], 73 y.o., female Today's Date: 07/10/2024  END OF SESSION:  PT End of Session - 07/10/24 1203     Visit Number 4    Number of Visits 20    Date for PT Re-Evaluation 09/11/24    Authorization Type [employer] Medicare    Progress Note Due on Visit 10    PT Start Time 1104    PT Stop Time 1153    PT Time Calculation (min) 49 min    Activity Tolerance Patient tolerated treatment well    Behavior During Therapy WFL for tasks assessed/performed            Past Medical History:  Diagnosis Date   Arthritis    Chronic kidney disease    stage 3   Heart murmur    Hypertension    Stroke [facility])    Past Surgical History:  Procedure Laterality Date   ABDOMINAL HYSTERECTOMY     ANTERIOR CERVICAL DECOMP/DISCECTOMY FUSION     ANTERIOR CRUCIATE LIGAMENT REPAIR Left    ARTHROPLASTY Bilateral    second toe   BACK SURGERY     BRAIN SURGERY     BUNIONECTOMY Bilateral    TOTAL KNEE ARTHROPLASTY Left 06/27/2024   Procedure: ARTHROPLASTY, KNEE, TOTAL;  Surgeon: Ernie Cough, MD;  Location: [facility];  Service: Orthopedics;  Laterality: Left;   Patient Active Problem List   Diagnosis Date Noted   S/P total knee arthroplasty, left 06/27/2024   S/P total knee replacement, left 06/27/2024    PCP: Andrew Truman GRADE., MD  REFERRING PROVIDER: Patti Rosina SAUNDERS, PA-C  REFERRING DIAG: [phone] (ICD-10-CM) - Unilateral primary osteoarthritis, left knee  THERAPY DIAG:  Chronic pain of left knee  Stiffness of left knee, not elsewhere classified  Muscle weakness (generalized)  Other muscle spasm  Localized edema  Other abnormalities of gait and mobility  Rationale for Evaluation and Treatment: Rehabilitation  ONSET DATE: 06/27/24 L TKA  SUBJECTIVE:   SUBJECTIVE STATEMENT: Pt reports increased pain in thigh, into anterior lower leg along where her bruising is from surgery.  She reports not wearing ted hose like she is supposed to, so she will start today.  From eval: Pt is now back at home and everything has been okay. I've been getting around real good. I don't always use the walker. Pt states her balance has been good. Has been icing as much as she can. Has been walking for exercise at the house at least every hour. Has been keeping up with her pain medication every 4 hours.   PERTINENT HISTORY: Back fusion (reports chronic foot drop), ACDF   PAIN:  Are you having pain? Yes: NPRS scale: 4/10 Pain location: L knee Pain description: tight Aggravating factors: Standing prolonged time Relieving factors: Ice, pain medication  PRECAUTIONS: None  RED FLAGS: None   WEIGHT BEARING RESTRICTIONS: No  FALLS:  Has patient fallen in last 6 months? No  LIVING ENVIRONMENT: Lives with: lives with their spouse Lives in: House/apartment Stairs: 2 story house with basement but doesn't have to do the steps Has following equipment at home: Vannie - 2 wheeled  OCCUPATION: Retired  PLOF: Independent  PATIENT GOALS: Improve knee strength and mobility  NEXT MD VISIT: 2 week f/u  OBJECTIVE:  Note: Objective measures were completed at Evaluation unless otherwise noted.  DIAGNOSTIC FINDINGS: n/a  PATIENT SURVEYS:  LEFS  Extreme difficulty/unable (0), Quite a bit of difficulty (1), Moderate  difficulty (2), Little difficulty (3), No difficulty (4) Survey date:  07/03/24  Any of your usual work, housework or school activities 1  2. Usual hobbies, recreational or sporting activities 1  3. Getting into/out of the bath 3  4. Walking between rooms 3  5. Putting on socks/shoes 3  6. Squatting  0  7. Lifting an object, like a bag of groceries from the floor 0  8. Performing light activities around your home 1  9. Performing heavy activities around your home 0  10. Getting into/out of a car 2  11. Walking 2 blocks 0  12. Walking 1 mile 0  13. Going up/down 10  stairs (1 flight) 0  14. Standing for 1 hour 0  15.  sitting for 1 hour 1  16. Running on even ground 0  17. Running on uneven ground 0  18. Making sharp turns while running fast 0  19. Hopping  0  20. Rolling over in bed 0  Score total:  15/80     COGNITION: Overall cognitive status: Within functional limits for tasks assessed     SENSATION: WFL  EDEMA:  40 cm on R, 44 cm on L circumferential  MUSCLE LENGTH: Did not assess  POSTURE: No Significant postural limitations and forward head  PALPATION: TTP L quad and anterior thigh  LOWER EXTREMITY ROM:   ROM Right eval Left eval L 07/07/24  Hip flexion     Hip extension     Hip abduction     Hip adduction     Hip internal rotation     Hip external rotation     Knee flexion 115 AROM 100 AROM 108 AAROM  105 - seated  Knee extension -5 LAQ -40 LAQ -12 PROM 20 LAQ  Ankle dorsiflexion     Ankle plantarflexion     Ankle inversion     Ankle eversion      (Blank rows = not tested)  LOWER EXTREMITY MMT:  MMT Right eval Left eval  Hip flexion 5 3-  Hip extension    Hip abduction    Hip adduction    Hip internal rotation    Hip external rotation    Knee flexion 5 4  Knee extension 5 3-  Ankle dorsiflexion    Ankle plantarflexion    Ankle inversion    Ankle eversion     (Blank rows = not tested)  LOWER EXTREMITY SPECIAL TESTS:  Did not assess  FUNCTIONAL TESTS:  5 times sit to stand: 11.22 sec with UE use/support TUG: 23.65 sec  GAIT: Distance walked: Into clinic Assistive device utilized: Walker - 2 wheeled Level of assistance: Modified independence Comments: Flexed trunk, slightly antalgic                                                                                                                                TREATMENT DATE:  07/10/24 NEUROMUSCULAR RE-EDUCATION: To improve kinesthesia and  proprioception.  Standing heel/toe raises x 20 Retro step x 20 Knee flexion stretch 6' step x 20;  added lateral movement x 20 Mini squat 2x10 - using mirror to even out weight Supine QS 10x5 - pushing into therapist hand  THERAPEUTIC EXERCISE: To improve strength, endurance and ROM.  Rec Bike x 6 min full rev Seated L HS curls RTB x 10 Seated L SAQ x 15 - cues to avoid using hip flexors Seated hip up and over L hip x 10  GAIT TRAINING: To normalize gait pattern and improve safety.  W/o AD- cues for increased R step length and L stance time, cues for heel strike   07/07/24 NEUROMUSCULAR RE-EDUCATION: To improve  kinesthesia and proprioception.  Retro step x 10 with cane Knee flexion lunge stretch x 20 4' Standing heel/toe raises 2 x 10 B Mini squats x 10 SLR + QS x 5 QS pushing into therapist hand- cues to avoid using glutes and curling toes  THERAPEUTIC EXERCISE: To improve strength, endurance, and ROM  Nustep L6x58min UE/LE Seated L SAQ 2x10 with assistance Seated lumbar flexion stretch green pball x1 min  GAIT TRAINING: To normalize gait pattern and improve safety.  With [facility] cues to increase stride length, heel/toe pattern, adjusted AD a notch lower  MANUAL THERAPY: To promote normalized muscle tension and for pain modulation  STM to R lumbar paraspinals 07/05/24 Nustep L6 x 8 min UEs/LEs Seated hamstring stretch x 30 Seated quad set 2x10 Seated SLR with hip abd x10 each knees straight and then bent Seated LAQ AAROM x10 Seated hamstring curl AAROM x10 Standing heel/toe raise x30 Standing hip abd 2x10 red TB Standing hip ext 2x10 red TB Gait training: 180' no a/d, working on hip extension and equal step length to decrease antalgic gait pattern. Able to perform with slowed gait speed   07/03/24 See HEP below    PATIENT EDUCATION:  Education details: HEP Person educated: Patient Education method: Explanation, Demonstration, and Handouts Education comprehension: verbalized understanding, returned demonstration, and needs further education  HOME EXERCISE  PROGRAM: Access Code: [email or web address] URL: [email or web address] Date: 07/03/2024 Prepared by: Gellen April Earnie Starring  Exercises - Supine Heel Slide with Strap  - 2 x daily - 7 x weekly - 1 sets - 10 reps - Supine Quad Set on Towel Roll  - 2-3 x daily - 7 x weekly - 2 sets - 10 reps - Heel Toe Raises with Counter Support  - 1 x daily - 7 x weekly - 2 sets - 10 reps - Standing Hip Abduction with Counter Support  - 1 x daily - 7 x weekly - 2 sets - 10 reps - Standing Hip Extension with Counter Support  - 1 x daily - 7 x weekly - 2 sets - 10 reps - Standing Hamstring Curl with Chair Support  - 1 x daily - 7 x weekly - 2 sets - 10 reps  ASSESSMENT:  CLINICAL IMPRESSION: Pt reports that her pain improved post session today. Pt shows better demo of QS with no compensation from gluteals. Focusing interventions on quad strengthening while standing and general knee strength. LAQ knee extension still measured at 20 deg, however knee flexion improved to 110 deg post session.  From eval: Patient is a 73 y.o. F who was seen today for physical therapy evaluation and treatment for L TKA on 06/27/24. Assessment is significant for reduced L knee ROM, strength, and stability affecting transfers and amb for home and community mobility. Pt will  benefit from PT to address these deficits to maximize her level of function.   OBJECTIVE IMPAIRMENTS: Abnormal gait, decreased activity tolerance, decreased balance, decreased coordination, decreased endurance, decreased mobility, difficulty walking, decreased ROM, decreased strength, hypomobility, increased edema, increased fascial restrictions, increased muscle spasms, improper body mechanics, postural dysfunction, and pain.   ACTIVITY LIMITATIONS: carrying, lifting, bending, sitting, standing, squatting, sleeping, stairs, transfers, bed mobility, bathing, toileting, dressing, hygiene/grooming, locomotion level, and caring for others  PARTICIPATION LIMITATIONS:  meal prep, cleaning, laundry, driving, shopping, community activity, and yard work  PERSONAL FACTORS: Age, Fitness, Past/current experiences, and Time since onset of injury/illness/exacerbation are also affecting patient's functional outcome.   REHAB POTENTIAL: Good  CLINICAL DECISION MAKING: Evolving/moderate complexity  EVALUATION COMPLEXITY: Moderate   GOALS: Goals reviewed with patient? Yes  SHORT TERM GOALS: Target date: 08/07/2024  Pt will be ind with initial HEP Baseline: Goal status: INITIAL  2.  Pt will demo L knee ROM from 5 to 115 deg Baseline:  Goal status: 07/07/24- see AROM chart  3.  Pt will report reduction in pain by >/=50% Baseline:  Goal status: INITIAL  LONG TERM GOALS: Target date: 09/11/2024   Pt will be ind with management and progression of HEP Baseline:  Goal status: INITIAL  2.  Pt will demo L knee ROM from 0 to at least 115 deg for stair negotiation Baseline:  Goal status: INITIAL  3.  Pt will be able to ascend/descend steps in a reciprocal fashion to demo increased quad control and strength Baseline:  Goal status: INITIAL  4.  Pt will have improved TUG to </=13 sec without a/d Baseline:  Goal status: INITIAL  5.  Pt will be able to amb >/=1000' independently for community mobility Baseline:  Goal status: INITIAL  6.  Pt will have improved LEFS to >/=24/80 to demo MCID Baseline: 15 Goal status: INITIAL   PLAN:  PT FREQUENCY: 2x/week  PT DURATION: 10 weeks  PLANNED INTERVENTIONS: 97164- PT Re-evaluation, 97750- Physical Performance Testing, 97110-Therapeutic exercises, 97530- Therapeutic activity, [id number]- Neuromuscular re-education, 97535- Self Care, 02859- Manual therapy, [phone]- Gait training, [phone]- Aquatic Therapy, [phone]- Electrical stimulation (unattended), 97016- Vasopneumatic device, [id number]- Ultrasound, [id number]- Ionotophoresis 4mg /ml Dexamethasone , 79439 (1-2 muscles), 20561 (3+ muscles)- Dry Needling, Patient/Family education,  Balance training, Stair training, Taping, Joint mobilization, Spinal mobilization, Cryotherapy, and Moist heat  PLAN FOR NEXT SESSION: Work on Retail buyer. Knee ROM.    Gabino Hagin L Boluwatife Mutchler, PTA 07/10/2024, 12:04 PM

## 2024-07-12 ENCOUNTER — Ambulatory Visit

## 2024-07-12 DIAGNOSIS — M6281 Muscle weakness (generalized): Secondary | ICD-10-CM

## 2024-07-12 DIAGNOSIS — M62838 Other muscle spasm: Secondary | ICD-10-CM

## 2024-07-12 DIAGNOSIS — R600 Localized edema: Secondary | ICD-10-CM

## 2024-07-12 DIAGNOSIS — G8929 Other chronic pain: Secondary | ICD-10-CM

## 2024-07-12 DIAGNOSIS — M25562 Pain in left knee: Secondary | ICD-10-CM | POA: Diagnosis not present

## 2024-07-12 DIAGNOSIS — R2689 Other abnormalities of gait and mobility: Secondary | ICD-10-CM

## 2024-07-12 DIAGNOSIS — M25662 Stiffness of left knee, not elsewhere classified: Secondary | ICD-10-CM

## 2024-07-12 NOTE — Therapy (Signed)
 OUTPATIENT PHYSICAL THERAPY LOWER EXTREMITY TREATMENT   Patient Name: Whitney Hess MRN: [id number] DOB:[birth year], 73 y.o., female Today's Date: 07/12/2024  END OF SESSION:  PT End of Session - 07/12/24 1412     Visit Number 5    Number of Visits 20    Date for PT Re-Evaluation 09/11/24    Authorization Type [employer]    Progress Note Due on Visit 10    PT Start Time 1408   pt late   PT Stop Time 1507    PT Time Calculation (min) 59 min    Activity Tolerance Patient tolerated treatment well    Behavior During Therapy WFL for tasks assessed/performed             Past Medical History:  Diagnosis Date   Arthritis    Chronic kidney disease    stage 3   Heart murmur    Hypertension    Stroke [facility])    Past Surgical History:  Procedure Laterality Date   ABDOMINAL HYSTERECTOMY     ANTERIOR CERVICAL DECOMP/DISCECTOMY FUSION     ANTERIOR CRUCIATE LIGAMENT REPAIR Left    ARTHROPLASTY Bilateral    second toe   BACK SURGERY     BRAIN SURGERY     BUNIONECTOMY Bilateral    TOTAL KNEE ARTHROPLASTY Left 06/27/2024   Procedure: ARTHROPLASTY, KNEE, TOTAL;  Surgeon: Ernie Cough, MD;  Location: [facility];  Service: Orthopedics;  Laterality: Left;   Patient Active Problem List   Diagnosis Date Noted   S/P total knee arthroplasty, left 06/27/2024   S/P total knee replacement, left 06/27/2024    PCP: Andrew Truman GRADE., MD  REFERRING PROVIDER: Patti Rosina SAUNDERS, PA-C  REFERRING DIAG: [phone] (ICD-10-CM) - Unilateral primary osteoarthritis, left knee  THERAPY DIAG:  Chronic pain of left knee  Stiffness of left knee, not elsewhere classified  Muscle weakness (generalized)  Other muscle spasm  Localized edema  Other abnormalities of gait and mobility  Rationale for Evaluation and Treatment: Rehabilitation  ONSET DATE: 06/27/24 L TKA  SUBJECTIVE:   SUBJECTIVE STATEMENT: Pt reports her bandage came off today but she put another bandage on per MD instructions.  Goes to MD office this afternoon.  From eval: Pt is now back at home and everything has been okay. I've been getting around real good. I don't always use the walker. Pt states her balance has been good. Has been icing as much as she can. Has been walking for exercise at the house at least every hour. Has been keeping up with her pain medication every 4 hours.   PERTINENT HISTORY: Back fusion (reports chronic foot drop), ACDF   PAIN:  Are you having pain? Yes: NPRS scale: 4/10 Pain location: L knee Pain description: tight Aggravating factors: Standing prolonged time Relieving factors: Ice, pain medication  PRECAUTIONS: None  RED FLAGS: None   WEIGHT BEARING RESTRICTIONS: No  FALLS:  Has patient fallen in last 6 months? No  LIVING ENVIRONMENT: Lives with: lives with their spouse Lives in: House/apartment Stairs: 2 story house with basement but doesn't have to do the steps Has following equipment at home: Vannie - 2 wheeled  OCCUPATION: Retired  PLOF: Independent  PATIENT GOALS: Improve knee strength and mobility  NEXT MD VISIT: 2 week f/u  OBJECTIVE:  Note: Objective measures were completed at Evaluation unless otherwise noted.  DIAGNOSTIC FINDINGS: n/a  PATIENT SURVEYS:  LEFS  Extreme difficulty/unable (0), Quite a bit of difficulty (1), Moderate difficulty (2), Little difficulty (3), No difficulty (  4) Survey date:  07/03/24  Any of your usual work, housework or school activities 1  2. Usual hobbies, recreational or sporting activities 1  3. Getting into/out of the bath 3  4. Walking between rooms 3  5. Putting on socks/shoes 3  6. Squatting  0  7. Lifting an object, like a bag of groceries from the floor 0  8. Performing light activities around your home 1  9. Performing heavy activities around your home 0  10. Getting into/out of a car 2  11. Walking 2 blocks 0  12. Walking 1 mile 0  13. Going up/down 10 stairs (1 flight) 0  14. Standing for 1 hour 0  15.   sitting for 1 hour 1  16. Running on even ground 0  17. Running on uneven ground 0  18. Making sharp turns while running fast 0  19. Hopping  0  20. Rolling over in bed 0  Score total:  15/80     COGNITION: Overall cognitive status: Within functional limits for tasks assessed     SENSATION: WFL  EDEMA:  40 cm on R, 44 cm on L circumferential  MUSCLE LENGTH: Did not assess  POSTURE: No Significant postural limitations and forward head  PALPATION: TTP L quad and anterior thigh  LOWER EXTREMITY ROM:   ROM Right eval Left eval L 07/07/24 L 07/12/24  Hip flexion      Hip extension      Hip abduction      Hip adduction      Hip internal rotation      Hip external rotation      Knee flexion 115 AROM 100 AROM 108 AAROM  105 - seated 110 supine  Knee extension -5 LAQ -40 LAQ -12 PROM 20 LAQ 15 LAQ  Ankle dorsiflexion      Ankle plantarflexion      Ankle inversion      Ankle eversion       (Blank rows = not tested)  LOWER EXTREMITY MMT:  MMT Right eval Left eval  Hip flexion 5 3-  Hip extension    Hip abduction    Hip adduction    Hip internal rotation    Hip external rotation    Knee flexion 5 4  Knee extension 5 3-  Ankle dorsiflexion    Ankle plantarflexion    Ankle inversion    Ankle eversion     (Blank rows = not tested)  LOWER EXTREMITY SPECIAL TESTS:  Did not assess  FUNCTIONAL TESTS:  5 times sit to stand: 11.22 sec with UE use/support TUG: 23.65 sec  GAIT: Distance walked: Into clinic Assistive device utilized: Walker - 2 wheeled Level of assistance: Modified independence Comments: Flexed trunk, slightly antalgic                                                                                                                                TREATMENT  DATE:  07/12/24 THERAPEUTIC EXERCISE: To improve strength, endurance, ROM, and flexibility.  Recumbent Bike x 6 min no resistance Supine L SLR + QS 2x10 Supine quad sets Knee ROM  measurement THERAPEUTIC ACTIVITIES: To improve functional performance.  Clock balance L LE 12 to 6 o'clock x 5 Step ups on 2' step x 20 2 HA Lateral step ups on 2' step x 20 2 HA Standing hip abduction 2 x 10 2lb Standing hip extension 2 x 10 2lb Standing marching x 10 2lb  Vaso to L knee x 10 min for edema and pain  07/10/24 NEUROMUSCULAR RE-EDUCATION: To improve kinesthesia and proprioception.  Standing heel/toe raises x 20 Retro step x 20 Knee flexion stretch 6' step x 20; added lateral movement x 20 Mini squat 2x10 - using mirror to even out weight Supine QS 10x5 - pushing into therapist hand  THERAPEUTIC EXERCISE: To improve strength, endurance and ROM.  Rec Bike x 6 min full rev Seated L HS curls RTB x 10 Seated L SAQ x 15 - cues to avoid using hip flexors Seated hip up and over L hip x 10  GAIT TRAINING: To normalize gait pattern and improve safety.  W/o AD- cues for increased R step length and L stance time, cues for heel strike   07/07/24 NEUROMUSCULAR RE-EDUCATION: To improve  kinesthesia and proprioception.  Retro step x 10 with cane Knee flexion lunge stretch x 20 4' Standing heel/toe raises 2 x 10 B Mini squats x 10 SLR + QS x 5 QS pushing into therapist hand- cues to avoid using glutes and curling toes  THERAPEUTIC EXERCISE: To improve strength, endurance, and ROM  Nustep L6x63min UE/LE Seated L SAQ 2x10 with assistance Seated lumbar flexion stretch green pball x1 min  GAIT TRAINING: To normalize gait pattern and improve safety.  With [facility] cues to increase stride length, heel/toe pattern, adjusted AD a notch lower  MANUAL THERAPY: To promote normalized muscle tension and for pain modulation  STM to R lumbar paraspinals 07/05/24 Nustep L6 x 8 min UEs/LEs Seated hamstring stretch x 30 Seated quad set 2x10 Seated SLR with hip abd x10 each knees straight and then bent Seated LAQ AAROM x10 Seated hamstring curl AAROM x10 Standing heel/toe raise x30 Standing  hip abd 2x10 red TB Standing hip ext 2x10 red TB Gait training: 180' no a/d, working on hip extension and equal step length to decrease antalgic gait pattern. Able to perform with slowed gait speed   07/03/24 See HEP below    PATIENT EDUCATION:  Education details: HEP Person educated: Patient Education method: Explanation, Demonstration, and Handouts Education comprehension: verbalized understanding, returned demonstration, and needs further education  HOME EXERCISE PROGRAM: Access Code: [email or web address] URL: [email or web address] Date: 07/03/2024 Prepared by: Gellen April Earnie Starring  Exercises - Supine Heel Slide with Strap  - 2 x daily - 7 x weekly - 1 sets - 10 reps - Supine Quad Set on Towel Roll  - 2-3 x daily - 7 x weekly - 2 sets - 10 reps - Heel Toe Raises with Counter Support  - 1 x daily - 7 x weekly - 2 sets - 10 reps - Standing Hip Abduction with Counter Support  - 1 x daily - 7 x weekly - 2 sets - 10 reps - Standing Hip Extension with Counter Support  - 1 x daily - 7 x weekly - 2 sets - 10 reps - Standing Hamstring Curl with Chair Support  - 1 x daily - 7  x weekly - 2 sets - 10 reps  ASSESSMENT:  CLINICAL IMPRESSION: Pt is progressing well. She tolerate the interventions well, however a little more labored today. SLR performance very well improved, able to demonstrate w/o quad lag. Her AROM is progressing well, now measured at 15-110 deg.  From eval: Patient is a 73 y.o. F who was seen today for physical therapy evaluation and treatment for L TKA on 06/27/24. Assessment is significant for reduced L knee ROM, strength, and stability affecting transfers and amb for home and community mobility. Pt will benefit from PT to address these deficits to maximize her level of function.   OBJECTIVE IMPAIRMENTS: Abnormal gait, decreased activity tolerance, decreased balance, decreased coordination, decreased endurance, decreased mobility, difficulty walking, decreased ROM,  decreased strength, hypomobility, increased edema, increased fascial restrictions, increased muscle spasms, improper body mechanics, postural dysfunction, and pain.   ACTIVITY LIMITATIONS: carrying, lifting, bending, sitting, standing, squatting, sleeping, stairs, transfers, bed mobility, bathing, toileting, dressing, hygiene/grooming, locomotion level, and caring for others  PARTICIPATION LIMITATIONS: meal prep, cleaning, laundry, driving, shopping, community activity, and yard work  PERSONAL FACTORS: Age, Fitness, Past/current experiences, and Time since onset of injury/illness/exacerbation are also affecting patient's functional outcome.   REHAB POTENTIAL: Good  CLINICAL DECISION MAKING: Evolving/moderate complexity  EVALUATION COMPLEXITY: Moderate   GOALS: Goals reviewed with patient? Yes  SHORT TERM GOALS: Target date: 08/07/2024  Pt will be ind with initial HEP Baseline: Goal status: INITIAL  2.  Pt will demo L knee ROM from 5 to 115 deg Baseline:  Goal status: 07/07/24- see AROM chart  3.  Pt will report reduction in pain by >/=50% Baseline:  Goal status: INITIAL  LONG TERM GOALS: Target date: 09/11/2024   Pt will be ind with management and progression of HEP Baseline:  Goal status: INITIAL  2.  Pt will demo L knee ROM from 0 to at least 115 deg for stair negotiation Baseline:  Goal status: INITIAL  3.  Pt will be able to ascend/descend steps in a reciprocal fashion to demo increased quad control and strength Baseline:  Goal status: INITIAL  4.  Pt will have improved TUG to </=13 sec without a/d Baseline:  Goal status: INITIAL  5.  Pt will be able to amb >/=1000' independently for community mobility Baseline:  Goal status: INITIAL  6.  Pt will have improved LEFS to >/=24/80 to demo MCID Baseline: 15 Goal status: INITIAL   PLAN:  PT FREQUENCY: 2x/week  PT DURATION: 10 weeks  PLANNED INTERVENTIONS: 97164- PT Re-evaluation, 97750- Physical  Performance Testing, 97110-Therapeutic exercises, 97530- Therapeutic activity, [id number]- Neuromuscular re-education, 97535- Self Care, [id number]- Manual therapy, [id number]- Gait training, [phone]- Aquatic Therapy, [phone]- Electrical stimulation (unattended), 97016- Vasopneumatic device, [id number]- Ultrasound, [id number]- Ionotophoresis 4mg /ml Dexamethasone , 79439 (1-2 muscles), [id number] (3+ muscles)- Dry Needling, Patient/Family education, Balance training, Stair training, Taping, Joint mobilization, Spinal mobilization, Cryotherapy, and Moist heat  PLAN FOR NEXT SESSION: 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 Knee ROM.    Brandis Wixted L Cicero Noy, PTA 07/12/2024, 3:05 PM

## 2024-07-14 ENCOUNTER — Ambulatory Visit

## 2024-07-14 DIAGNOSIS — R2689 Other abnormalities of gait and mobility: Secondary | ICD-10-CM

## 2024-07-14 DIAGNOSIS — M25562 Pain in left knee: Secondary | ICD-10-CM | POA: Diagnosis not present

## 2024-07-14 DIAGNOSIS — R600 Localized edema: Secondary | ICD-10-CM

## 2024-07-14 DIAGNOSIS — M62838 Other muscle spasm: Secondary | ICD-10-CM

## 2024-07-14 DIAGNOSIS — G8929 Other chronic pain: Secondary | ICD-10-CM

## 2024-07-14 DIAGNOSIS — M6281 Muscle weakness (generalized): Secondary | ICD-10-CM

## 2024-07-14 DIAGNOSIS — M25662 Stiffness of left knee, not elsewhere classified: Secondary | ICD-10-CM

## 2024-07-14 NOTE — Therapy (Signed)
 OUTPATIENT PHYSICAL THERAPY LOWER EXTREMITY TREATMENT   Patient Name: Whitney Hess MRN: [id number] DOB:[birth year], 73 y.o., female Today's Date: 07/14/2024  END OF SESSION:  PT End of Session - 07/14/24 1113     Visit Number 6    Number of Visits 20    Date for PT Re-Evaluation 09/11/24    Authorization Type [employer]    Progress Note Due on Visit 10    PT Start Time 1030   pt late   PT Stop Time 1110    PT Time Calculation (min) 40 min    Activity Tolerance Patient tolerated treatment well    Behavior During Therapy WFL for tasks assessed/performed              Past Medical History:  Diagnosis Date   Arthritis    Chronic kidney disease    stage 3   Heart murmur    Hypertension    Stroke [facility])    Past Surgical History:  Procedure Laterality Date   ABDOMINAL HYSTERECTOMY     ANTERIOR CERVICAL DECOMP/DISCECTOMY FUSION     ANTERIOR CRUCIATE LIGAMENT REPAIR Left    ARTHROPLASTY Bilateral    second toe   BACK SURGERY     BRAIN SURGERY     BUNIONECTOMY Bilateral    TOTAL KNEE ARTHROPLASTY Left 06/27/2024   Procedure: ARTHROPLASTY, KNEE, TOTAL;  Surgeon: Ernie Cough, MD;  Location: [facility];  Service: Orthopedics;  Laterality: Left;   Patient Active Problem List   Diagnosis Date Noted   S/P total knee arthroplasty, left 06/27/2024   S/P total knee replacement, left 06/27/2024    PCP: Andrew Truman GRADE., MD  REFERRING PROVIDER: Patti Rosina SAUNDERS, PA-C  REFERRING DIAG: [phone] (ICD-10-CM) - Unilateral primary osteoarthritis, left knee  THERAPY DIAG:  Chronic pain of left knee  Stiffness of left knee, not elsewhere classified  Muscle weakness (generalized)  Other muscle spasm  Localized edema  Other abnormalities of gait and mobility  Rationale for Evaluation and Treatment: Rehabilitation  ONSET DATE: 06/27/24 L TKA  SUBJECTIVE:   SUBJECTIVE STATEMENT: Pt reports PA says she doesn't need continue PT if she doesn't want to but she wants to  continue at least until the end of the month.  From eval: Pt is now back at home and everything has been okay. I've been getting around real good. I don't always use the walker. Pt states her balance has been good. Has been icing as much as she can. Has been walking for exercise at the house at least every hour. Has been keeping up with her pain medication every 4 hours.   PERTINENT HISTORY: Back fusion (reports chronic foot drop), ACDF   PAIN:  Are you having pain? Yes: NPRS scale: 5/10 Pain location: L knee Pain description: tight Aggravating factors: Standing prolonged time Relieving factors: Ice, pain medication  PRECAUTIONS: None  RED FLAGS: None   WEIGHT BEARING RESTRICTIONS: No  FALLS:  Has patient fallen in last 6 months? No  LIVING ENVIRONMENT: Lives with: lives with their spouse Lives in: House/apartment Stairs: 2 story house with basement but doesn't have to do the steps Has following equipment at home: Vannie - 2 wheeled  OCCUPATION: Retired  PLOF: Independent  PATIENT GOALS: Improve knee strength and mobility  NEXT MD VISIT: 2 week f/u  OBJECTIVE:  Note: Objective measures were completed at Evaluation unless otherwise noted.  DIAGNOSTIC FINDINGS: n/a  PATIENT SURVEYS:  LEFS  Extreme difficulty/unable (0), Quite a bit of difficulty (1), Moderate difficulty (  2), Little difficulty (3), No difficulty (4) Survey date:  07/03/24  Any of your usual work, housework or school activities 1  2. Usual hobbies, recreational or sporting activities 1  3. Getting into/out of the bath 3  4. Walking between rooms 3  5. Putting on socks/shoes 3  6. Squatting  0  7. Lifting an object, like a bag of groceries from the floor 0  8. Performing light activities around your home 1  9. Performing heavy activities around your home 0  10. Getting into/out of a car 2  11. Walking 2 blocks 0  12. Walking 1 mile 0  13. Going up/down 10 stairs (1 flight) 0  14. Standing for 1  hour 0  15.  sitting for 1 hour 1  16. Running on even ground 0  17. Running on uneven ground 0  18. Making sharp turns while running fast 0  19. Hopping  0  20. Rolling over in bed 0  Score total:  15/80     COGNITION: Overall cognitive status: Within functional limits for tasks assessed     SENSATION: WFL  EDEMA:  40 cm on R, 44 cm on L circumferential  MUSCLE LENGTH: Did not assess  POSTURE: No Significant postural limitations and forward head  PALPATION: TTP L quad and anterior thigh  LOWER EXTREMITY ROM:   ROM Right eval Left eval L 07/07/24 L 07/12/24  Hip flexion      Hip extension      Hip abduction      Hip adduction      Hip internal rotation      Hip external rotation      Knee flexion 115 AROM 100 AROM 108 AAROM  105 - seated 110 supine  Knee extension -5 LAQ -40 LAQ -12 PROM 20 LAQ 15 LAQ  Ankle dorsiflexion      Ankle plantarflexion      Ankle inversion      Ankle eversion       (Blank rows = not tested)  LOWER EXTREMITY MMT:  MMT Right eval Left eval  Hip flexion 5 3-  Hip extension    Hip abduction    Hip adduction    Hip internal rotation    Hip external rotation    Knee flexion 5 4  Knee extension 5 3-  Ankle dorsiflexion    Ankle plantarflexion    Ankle inversion    Ankle eversion     (Blank rows = not tested)  LOWER EXTREMITY SPECIAL TESTS:  Did not assess  FUNCTIONAL TESTS:  5 times sit to stand: 11.22 sec with UE use/support TUG: 23.65 sec  GAIT: Distance walked: Into clinic Assistive device utilized: Walker - 2 wheeled Level of assistance: Modified independence Comments: Flexed trunk, slightly antalgic  TREATMENT DATE:  07/14/24 THERAPEUTIC ACTIVITIES: To improve functional performance.  Step ups 4' LLE x 20 - 1HA Lateral step ups 4' LLE x 20 - HA Standing GTB TKE 10x5  THERAPEUTIC  EXERCISE: To improve strength, endurance, ROM, and flexibility.  Bike x 5 min Seated LLE LAQ 2lb 2x10 Seated LLE HS curl GTB 2x10  07/12/24 THERAPEUTIC EXERCISE: To improve strength, endurance, ROM, and flexibility.  Recumbent Bike x 6 min no resistance Supine L SLR + QS 2x10 Supine quad sets Knee ROM measurement THERAPEUTIC ACTIVITIES: To improve functional performance.  Clock balance L LE 12 to 6 o'clock x 5 Step ups on 2' step x 20 2 HA Lateral step ups on 2' step x 20 2 HA Standing hip abduction 2 x 10 2lb Standing hip extension 2 x 10 2lb Standing marching x 10 2lb  Vaso to L knee x 10 min for edema and pain  07/10/24 NEUROMUSCULAR RE-EDUCATION: To improve kinesthesia and proprioception.  Standing heel/toe raises x 20 Retro step x 20 Knee flexion stretch 6' step x 20; added lateral movement x 20 Mini squat 2x10 - using mirror to even out weight Supine QS 10x5 - pushing into therapist hand  THERAPEUTIC EXERCISE: To improve strength, endurance and ROM.  Rec Bike x 6 min full rev Seated L HS curls RTB x 10 Seated L SAQ x 15 - cues to avoid using hip flexors Seated hip up and over L hip x 10  GAIT TRAINING: To normalize gait pattern and improve safety.  W/o AD- cues for increased R step length and L stance time, cues for heel strike   07/07/24 NEUROMUSCULAR RE-EDUCATION: To improve  kinesthesia and proprioception.  Retro step x 10 with cane Knee flexion lunge stretch x 20 4' Standing heel/toe raises 2 x 10 B Mini squats x 10 SLR + QS x 5 QS pushing into therapist hand- cues to avoid using glutes and curling toes  THERAPEUTIC EXERCISE: To improve strength, endurance, and ROM  Nustep L6x61min UE/LE Seated L SAQ 2x10 with assistance Seated lumbar flexion stretch green pball x1 min  GAIT TRAINING: To normalize gait pattern and improve safety.  With [facility] cues to increase stride length, heel/toe pattern, adjusted AD a notch lower  MANUAL THERAPY: To promote normalized  muscle tension and for pain modulation  STM to R lumbar paraspinals 07/05/24 Nustep L6 x 8 min UEs/LEs Seated hamstring stretch x 30 Seated quad set 2x10 Seated SLR with hip abd x10 each knees straight and then bent Seated LAQ AAROM x10 Seated hamstring curl AAROM x10 Standing heel/toe raise x30 Standing hip abd 2x10 red TB Standing hip ext 2x10 red TB Gait training: 180' no a/d, working on hip extension and equal step length to decrease antalgic gait pattern. Able to perform with slowed gait speed   07/03/24 See HEP below    PATIENT EDUCATION:  Education details: HEP Person educated: Patient Education method: Explanation, Demonstration, and Handouts Education comprehension: verbalized understanding, returned demonstration, and needs further education  HOME EXERCISE PROGRAM: Access Code: [email or web address] URL: [email or web address] Date: 07/03/2024 Prepared by: Gellen April Marie Nonato  Exercises - Supine Heel Slide with Strap  - 2 x daily - 7 x weekly - 1 sets - 10 reps - Supine Quad Set on Towel Roll  - 2-3 x daily - 7 x weekly - 2 sets - 10 reps - Heel Toe Raises with Counter Support  - 1 x daily - 7 x weekly - 2 sets - 10 reps -  Standing Hip Abduction with Counter Support  - 1 x daily - 7 x weekly - 2 sets - 10 reps - Standing Hip Extension with Counter Support  - 1 x daily - 7 x weekly - 2 sets - 10 reps - Standing Hamstring Curl with Chair Support  - 1 x daily - 7 x weekly - 2 sets - 10 reps  ASSESSMENT:  CLINICAL IMPRESSION: Pt is progressing well, she is having trouble with step ups leading w/ LLE. Cues provided as needed to correct her form as needed. Pt reports she will finish PT at the end of this month. Finished with ice pack to L knee post session.   From eval: Patient is a 73 y.o. F who was seen today for physical therapy evaluation and treatment for L TKA on 06/27/24. Assessment is significant for reduced L knee ROM, strength, and stability affecting  transfers and amb for home and community mobility. Pt will benefit from PT to address these deficits to maximize her level of function.   OBJECTIVE IMPAIRMENTS: Abnormal gait, decreased activity tolerance, decreased balance, decreased coordination, decreased endurance, decreased mobility, difficulty walking, decreased ROM, decreased strength, hypomobility, increased edema, increased fascial restrictions, increased muscle spasms, improper body mechanics, postural dysfunction, and pain.   ACTIVITY LIMITATIONS: carrying, lifting, bending, sitting, standing, squatting, sleeping, stairs, transfers, bed mobility, bathing, toileting, dressing, hygiene/grooming, locomotion level, and caring for others  PARTICIPATION LIMITATIONS: meal prep, cleaning, laundry, driving, shopping, community activity, and yard work  PERSONAL FACTORS: Age, Fitness, Past/current experiences, and Time since onset of injury/illness/exacerbation are also affecting patient's functional outcome.   REHAB POTENTIAL: Good  CLINICAL DECISION MAKING: Evolving/moderate complexity  EVALUATION COMPLEXITY: Moderate   GOALS: Goals reviewed with patient? Yes  SHORT TERM GOALS: Target date: 08/07/2024  Pt will be ind with initial HEP Baseline: Goal status: INITIAL  2.  Pt will demo L knee ROM from 5 to 115 deg Baseline:  Goal status: 07/07/24- see AROM chart  3.  Pt will report reduction in pain by >/=50% Baseline:  Goal status: INITIAL  LONG TERM GOALS: Target date: 09/11/2024   Pt will be ind with management and progression of HEP Baseline:  Goal status: INITIAL  2.  Pt will demo L knee ROM from 0 to at least 115 deg for stair negotiation Baseline:  Goal status: INITIAL  3.  Pt will be able to ascend/descend steps in a reciprocal fashion to demo increased quad control and strength Baseline:  Goal status: INITIAL  4.  Pt will have improved TUG to </=13 sec without a/d Baseline:  Goal status: INITIAL  5.  Pt will  be able to amb >/=1000' independently for community mobility Baseline:  Goal status: INITIAL  6.  Pt will have improved LEFS to >/=24/80 to demo MCID Baseline: 15 Goal status: INITIAL   PLAN:  PT FREQUENCY: 2x/week  PT DURATION: 10 weeks  PLANNED INTERVENTIONS: 97164- PT Re-evaluation, 97750- Physical Performance Testing, 97110-Therapeutic exercises, 97530- Therapeutic activity, [id number]- Neuromuscular re-education, 97535- Self Care, [id number]- Manual therapy, [id number]- Gait training, [phone]- Aquatic Therapy, [id number]- Electrical stimulation (unattended), 97016- Vasopneumatic device, [id number]- Ultrasound, [id number]- Ionotophoresis 4mg /ml Dexamethasone , 79439 (1-2 muscles), [id number] (3+ muscles)- Dry Needling, Patient/Family education, Balance training, Stair training, Taping, Joint mobilization, Spinal mobilization, Cryotherapy, and Moist heat  PLAN FOR NEXT SESSION: 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 Knee ROM.    Liisa Picone L Kaliq Lege, PTA 07/14/2024, 12:04 PM

## 2024-07-17 ENCOUNTER — Ambulatory Visit

## 2024-07-17 DIAGNOSIS — M62838 Other muscle spasm: Secondary | ICD-10-CM

## 2024-07-17 DIAGNOSIS — G8929 Other chronic pain: Secondary | ICD-10-CM

## 2024-07-17 DIAGNOSIS — M25662 Stiffness of left knee, not elsewhere classified: Secondary | ICD-10-CM

## 2024-07-17 DIAGNOSIS — R2689 Other abnormalities of gait and mobility: Secondary | ICD-10-CM

## 2024-07-17 DIAGNOSIS — M25562 Pain in left knee: Secondary | ICD-10-CM | POA: Diagnosis not present

## 2024-07-17 DIAGNOSIS — M6281 Muscle weakness (generalized): Secondary | ICD-10-CM

## 2024-07-17 DIAGNOSIS — R600 Localized edema: Secondary | ICD-10-CM

## 2024-07-17 NOTE — Therapy (Signed)
 OUTPATIENT PHYSICAL THERAPY LOWER EXTREMITY TREATMENT   Patient Name: Whitney Hess MRN: [id number] DOB:03-Mar-1951, 73 y.o., female Today's Date: 07/17/2024  END OF SESSION:  PT End of Session - 07/17/24 1444     Visit Number 7    Number of Visits 20    Date for PT Re-Evaluation 09/11/24    Authorization Type [employer]    Progress Note Due on Visit 10    PT Start Time 1403    PT Stop Time 1445    PT Time Calculation (min) 42 min    Activity Tolerance Patient tolerated treatment well    Behavior During Therapy WFL for tasks assessed/performed               Past Medical History:  Diagnosis Date   Arthritis    Chronic kidney disease    stage 3   Heart murmur    Hypertension    Stroke [facility])    Past Surgical History:  Procedure Laterality Date   ABDOMINAL HYSTERECTOMY     ANTERIOR CERVICAL DECOMP/DISCECTOMY FUSION     ANTERIOR CRUCIATE LIGAMENT REPAIR Left    ARTHROPLASTY Bilateral    second toe   BACK SURGERY     BRAIN SURGERY     BUNIONECTOMY Bilateral    TOTAL KNEE ARTHROPLASTY Left 06/27/2024   Procedure: ARTHROPLASTY, KNEE, TOTAL;  Surgeon: Ernie Cough, MD;  Location: [facility];  Service: Orthopedics;  Laterality: Left;   Patient Active Problem List   Diagnosis Date Noted   S/P total knee arthroplasty, left 06/27/2024   S/P total knee replacement, left 06/27/2024    PCP: Andrew Truman GRADE., MD  REFERRING PROVIDER: Patti Rosina SAUNDERS, PA-C  REFERRING DIAG: [phone] (ICD-10-CM) - Unilateral primary osteoarthritis, left knee  THERAPY DIAG:  Chronic pain of left knee  Stiffness of left knee, not elsewhere classified  Muscle weakness (generalized)  Other muscle spasm  Localized edema  Other abnormalities of gait and mobility  Rationale for Evaluation and Treatment: Rehabilitation  ONSET DATE: 06/27/24 L TKA  SUBJECTIVE:   SUBJECTIVE STATEMENT: Pt reports her pain is always worse with the rainy weather   From eval: Pt is now back at  home and everything has been okay. I've been getting around real good. I don't always use the walker. Pt states her balance has been good. Has been icing as much as she can. Has been walking for exercise at the house at least every hour. Has been keeping up with her pain medication every 4 hours.   PERTINENT HISTORY: Back fusion (reports chronic foot drop), ACDF   PAIN:  Are you having pain? Yes: NPRS scale: 5/10 Pain location: L knee Pain description: tight Aggravating factors: Standing prolonged time Relieving factors: Ice, pain medication  PRECAUTIONS: None  RED FLAGS: None   WEIGHT BEARING RESTRICTIONS: No  FALLS:  Has patient fallen in last 6 months? No  LIVING ENVIRONMENT: Lives with: lives with their spouse Lives in: House/apartment Stairs: 2 story house with basement but doesn't have to do the steps Has following equipment at home: Vannie - 2 wheeled  OCCUPATION: Retired  PLOF: Independent  PATIENT GOALS: Improve knee strength and mobility  NEXT MD VISIT: 2 week f/u  OBJECTIVE:  Note: Objective measures were completed at Evaluation unless otherwise noted.  DIAGNOSTIC FINDINGS: n/a  PATIENT SURVEYS:  LEFS  Extreme difficulty/unable (0), Quite a bit of difficulty (1), Moderate difficulty (2), Little difficulty (3), No difficulty (4) Survey date:  07/03/24  Any of your usual work,  housework or school activities 1  2. Usual hobbies, recreational or sporting activities 1  3. Getting into/out of the bath 3  4. Walking between rooms 3  5. Putting on socks/shoes 3  6. Squatting  0  7. Lifting an object, like a bag of groceries from the floor 0  8. Performing light activities around your home 1  9. Performing heavy activities around your home 0  10. Getting into/out of a car 2  11. Walking 2 blocks 0  12. Walking 1 mile 0  13. Going up/down 10 stairs (1 flight) 0  14. Standing for 1 hour 0  15.  sitting for 1 hour 1  16. Running on even ground 0  17.  Running on uneven ground 0  18. Making sharp turns while running fast 0  19. Hopping  0  20. Rolling over in bed 0  Score total:  15/80     COGNITION: Overall cognitive status: Within functional limits for tasks assessed     SENSATION: WFL  EDEMA:  40 cm on R, 44 cm on L circumferential  MUSCLE LENGTH: Did not assess  POSTURE: No Significant postural limitations and forward head  PALPATION: TTP L quad and anterior thigh  LOWER EXTREMITY ROM:   ROM Right eval Left eval L 07/07/24 L 07/12/24  Hip flexion      Hip extension      Hip abduction      Hip adduction      Hip internal rotation      Hip external rotation      Knee flexion 115 AROM 100 AROM 108 AAROM  105 - seated 110 supine  Knee extension -5 LAQ -40 LAQ -12 PROM 20 LAQ 15 LAQ  Ankle dorsiflexion      Ankle plantarflexion      Ankle inversion      Ankle eversion       (Blank rows = not tested)  LOWER EXTREMITY MMT:  MMT Right eval Left eval  Hip flexion 5 3-  Hip extension    Hip abduction    Hip adduction    Hip internal rotation    Hip external rotation    Knee flexion 5 4  Knee extension 5 3-  Ankle dorsiflexion    Ankle plantarflexion    Ankle inversion    Ankle eversion     (Blank rows = not tested)  LOWER EXTREMITY SPECIAL TESTS:  Did not assess  FUNCTIONAL TESTS:  5 times sit to stand: 11.22 sec with UE use/support TUG: 23.65 sec  GAIT: Distance walked: Into clinic Assistive device utilized: Walker - 2 wheeled Level of assistance: Modified independence Comments: Flexed trunk, slightly antalgic                                                                                                                                TREATMENT DATE:  07/17/24 Bike L1 x 8 min Step ups 4'  x 20 RLE 1HA Lateral step ups 4' x 20 RLE HA Step downs RLE 2' x 10 Seated LAQ BLE 3lb x 20 Seated HS curl GTB x 20 BLE Checked patellar mobility- WNL all directions SLR x 10  LLE  07/14/24 THERAPEUTIC ACTIVITIES: To improve functional performance.  Step ups 4' LLE x 20 - 1HA Lateral step ups 4' LLE x 20 - HA Standing GTB TKE 10x5  THERAPEUTIC EXERCISE: To improve strength, endurance, ROM, and flexibility.  Bike x 5 min Seated LLE LAQ 2lb 2x10 Seated LLE HS curl GTB 2x10  07/12/24 THERAPEUTIC EXERCISE: To improve strength, endurance, ROM, and flexibility.  Recumbent Bike x 6 min no resistance Supine L SLR + QS 2x10 Supine quad sets Knee ROM measurement THERAPEUTIC ACTIVITIES: To improve functional performance.  Clock balance L LE 12 to 6 o'clock x 5 Step ups on 2' step x 20 2 HA Lateral step ups on 2' step x 20 2 HA Standing hip abduction 2 x 10 2lb Standing hip extension 2 x 10 2lb Standing marching x 10 2lb  Vaso to L knee x 10 min for edema and pain  07/10/24 NEUROMUSCULAR RE-EDUCATION: To improve kinesthesia and proprioception.  Standing heel/toe raises x 20 Retro step x 20 Knee flexion stretch 6' step x 20; added lateral movement x 20 Mini squat 2x10 - using mirror to even out weight Supine QS 10x5 - pushing into therapist hand  THERAPEUTIC EXERCISE: To improve strength, endurance and ROM.  Rec Bike x 6 min full rev Seated L HS curls RTB x 10 Seated L SAQ x 15 - cues to avoid using hip flexors Seated hip up and over L hip x 10  GAIT TRAINING: To normalize gait pattern and improve safety.  W/o AD- cues for increased R step length and L stance time, cues for heel strike   07/07/24 NEUROMUSCULAR RE-EDUCATION: To improve  kinesthesia and proprioception.  Retro step x 10 with cane Knee flexion lunge stretch x 20 4' Standing heel/toe raises 2 x 10 B Mini squats x 10 SLR + QS x 5 QS pushing into therapist hand- cues to avoid using glutes and curling toes  THERAPEUTIC EXERCISE: To improve strength, endurance, and ROM  Nustep L6x38min UE/LE Seated L SAQ 2x10 with assistance Seated lumbar flexion stretch green pball x1 min  GAIT TRAINING:  To normalize gait pattern and improve safety.  With [facility] cues to increase stride length, heel/toe pattern, adjusted AD a notch lower  MANUAL THERAPY: To promote normalized muscle tension and for pain modulation  STM to R lumbar paraspinals 07/05/24 Nustep L6 x 8 min UEs/LEs Seated hamstring stretch x 30 Seated quad set 2x10 Seated SLR with hip abd x10 each knees straight and then bent Seated LAQ AAROM x10 Seated hamstring curl AAROM x10 Standing heel/toe raise x30 Standing hip abd 2x10 red TB Standing hip ext 2x10 red TB Gait training: 180' no a/d, working on hip extension and equal step length to decrease antalgic gait pattern. Able to perform with slowed gait speed   07/03/24 See HEP below    PATIENT EDUCATION:  Education details: HEP Person educated: Patient Education method: Explanation, Demonstration, and Handouts Education comprehension: verbalized understanding, returned demonstration, and needs further education  HOME EXERCISE PROGRAM: Access Code: [email or web address] URL: [email or web address] Date: 07/03/2024 Prepared by: Gellen April Marie Nonato  Exercises - Supine Heel Slide with Strap  - 2 x daily - 7 x weekly - 1 sets - 10 reps - Supine Quad Set on Towel  Roll  - 2-3 x daily - 7 x weekly - 2 sets - 10 reps - Heel Toe Raises with Counter Support  - 1 x daily - 7 x weekly - 2 sets - 10 reps - Standing Hip Abduction with Counter Support  - 1 x daily - 7 x weekly - 2 sets - 10 reps - Standing Hip Extension with Counter Support  - 1 x daily - 7 x weekly - 2 sets - 10 reps - Standing Hamstring Curl with Chair Support  - 1 x daily - 7 x weekly - 2 sets - 10 reps  ASSESSMENT:  CLINICAL IMPRESSION: Worked on strengthening for BLE to patient's tolerance. Pt noting more pain in LLE today from rain storm last night. Cues required to correct her form and target the appropriate muscles. Will continue to progress strengthening as tolerated.    From eval: Patient is a 73  y.o. F who was seen today for physical therapy evaluation and treatment for L TKA on 06/27/24. Assessment is significant for reduced L knee ROM, strength, and stability affecting transfers and amb for home and community mobility. Pt will benefit from PT to address these deficits to maximize her level of function.   OBJECTIVE IMPAIRMENTS: Abnormal gait, decreased activity tolerance, decreased balance, decreased coordination, decreased endurance, decreased mobility, difficulty walking, decreased ROM, decreased strength, hypomobility, increased edema, increased fascial restrictions, increased muscle spasms, improper body mechanics, postural dysfunction, and pain.   ACTIVITY LIMITATIONS: carrying, lifting, bending, sitting, standing, squatting, sleeping, stairs, transfers, bed mobility, bathing, toileting, dressing, hygiene/grooming, locomotion level, and caring for others  PARTICIPATION LIMITATIONS: meal prep, cleaning, laundry, driving, shopping, community activity, and yard work  PERSONAL FACTORS: Age, Fitness, Past/current experiences, and Time since onset of injury/illness/exacerbation are also affecting patient's functional outcome.   REHAB POTENTIAL: Good  CLINICAL DECISION MAKING: Evolving/moderate complexity  EVALUATION COMPLEXITY: Moderate   GOALS: Goals reviewed with patient? Yes  SHORT TERM GOALS: Target date: 08/07/2024  Pt will be ind with initial HEP Baseline: Goal status: INITIAL  2.  Pt will demo L knee ROM from 5 to 115 deg Baseline:  Goal status: 07/07/24- see AROM chart  3.  Pt will report reduction in pain by >/=50% Baseline:  Goal status: INITIAL  LONG TERM GOALS: Target date: 09/11/2024   Pt will be ind with management and progression of HEP Baseline:  Goal status: INITIAL  2.  Pt will demo L knee ROM from 0 to at least 115 deg for stair negotiation Baseline:  Goal status: INITIAL  3.  Pt will be able to ascend/descend steps in a reciprocal fashion to demo  increased quad control and strength Baseline:  Goal status: INITIAL  4.  Pt will have improved TUG to </=13 sec without a/d Baseline:  Goal status: INITIAL  5.  Pt will be able to amb >/=1000' independently for community mobility Baseline:  Goal status: INITIAL  6.  Pt will have improved LEFS to >/=24/80 to demo MCID Baseline: 15 Goal status: INITIAL   PLAN:  PT FREQUENCY: 2x/week  PT DURATION: 10 weeks  PLANNED INTERVENTIONS: 97164- PT Re-evaluation, 97750- Physical Performance Testing, 97110-Therapeutic exercises, 97530- Therapeutic activity, 97112- Neuromuscular re-education, 97535- Self Care, [id number]- Manual therapy, [id number]- Gait training, [phone]- Aquatic Therapy, [phone]- Electrical stimulation (unattended), 97016- Vasopneumatic device, [id number]- Ultrasound, [id number]- Ionotophoresis 4mg /ml Dexamethasone , [id number] (1-2 muscles), [id number] (3+ muscles)- Dry Needling, Patient/Family education, Balance training, Stair training, Taping, Joint mobilization, Spinal mobilization, Cryotherapy, and Moist heat  PLAN FOR NEXT SESSION: Work  on quad activation/strengthening. Knee ROM.    Geneieve Duell L Aldridge Krzyzanowski, PTA 07/17/2024, 2:45 PM

## 2024-07-20 ENCOUNTER — Ambulatory Visit

## 2024-07-20 DIAGNOSIS — M62838 Other muscle spasm: Secondary | ICD-10-CM

## 2024-07-20 DIAGNOSIS — R600 Localized edema: Secondary | ICD-10-CM

## 2024-07-20 DIAGNOSIS — G8929 Other chronic pain: Secondary | ICD-10-CM

## 2024-07-20 DIAGNOSIS — M6281 Muscle weakness (generalized): Secondary | ICD-10-CM

## 2024-07-20 DIAGNOSIS — M25562 Pain in left knee: Secondary | ICD-10-CM | POA: Diagnosis not present

## 2024-07-20 DIAGNOSIS — R2689 Other abnormalities of gait and mobility: Secondary | ICD-10-CM

## 2024-07-20 DIAGNOSIS — M25662 Stiffness of left knee, not elsewhere classified: Secondary | ICD-10-CM

## 2024-07-20 NOTE — Therapy (Signed)
 OUTPATIENT PHYSICAL THERAPY LOWER EXTREMITY TREATMENT   Patient Name: Whitney Hess MRN: [id number] DOB:Feb 14, 1951, 73 y.o., female Today's Date: 07/20/2024  END OF SESSION:  PT End of Session - 07/20/24 1451     Visit Number 8    Number of Visits 20    Date for PT Re-Evaluation 09/11/24    Authorization Type [employer]    Progress Note Due on Visit 10    PT Start Time 1403    PT Stop Time 1458    PT Time Calculation (min) 55 min    Activity Tolerance Patient tolerated treatment well    Behavior During Therapy WFL for tasks assessed/performed                Past Medical History:  Diagnosis Date   Arthritis    Chronic kidney disease    stage 3   Heart murmur    Hypertension    Stroke [facility])    Past Surgical History:  Procedure Laterality Date   ABDOMINAL HYSTERECTOMY     ANTERIOR CERVICAL DECOMP/DISCECTOMY FUSION     ANTERIOR CRUCIATE LIGAMENT REPAIR Left    ARTHROPLASTY Bilateral    second toe   BACK SURGERY     BRAIN SURGERY     BUNIONECTOMY Bilateral    TOTAL KNEE ARTHROPLASTY Left 06/27/2024   Procedure: ARTHROPLASTY, KNEE, TOTAL;  Surgeon: Ernie Cough, MD;  Location: [facility];  Service: Orthopedics;  Laterality: Left;   Patient Active Problem List   Diagnosis Date Noted   S/P total knee arthroplasty, left 06/27/2024   S/P total knee replacement, left 06/27/2024    PCP: Andrew Truman GRADE., MD  REFERRING PROVIDER: Patti Rosina SAUNDERS, PA-C  REFERRING DIAG: [id number] (ICD-10-CM) - Unilateral primary osteoarthritis, left knee  THERAPY DIAG:  Chronic pain of left knee  Stiffness of left knee, not elsewhere classified  Muscle weakness (generalized)  Other muscle spasm  Localized edema  Other abnormalities of gait and mobility  Rationale for Evaluation and Treatment: Rehabilitation  ONSET DATE: 06/27/24 L TKA  SUBJECTIVE:   SUBJECTIVE STATEMENT: Pt reports pain, just a different kind of pain.  From eval: Pt is now back at home and  everything has been okay. I've been getting around real good. I don't always use the walker. Pt states her balance has been good. Has been icing as much as she can. Has been walking for exercise at the house at least every hour. Has been keeping up with her pain medication every 4 hours.   PERTINENT HISTORY: Back fusion (reports chronic foot drop), ACDF   PAIN:  Are you having pain? Yes: NPRS scale: 5/10 Pain location: L knee Pain description: tight Aggravating factors: Standing prolonged time Relieving factors: Ice, pain medication  PRECAUTIONS: None  RED FLAGS: None   WEIGHT BEARING RESTRICTIONS: No  FALLS:  Has patient fallen in last 6 months? No  LIVING ENVIRONMENT: Lives with: lives with their spouse Lives in: House/apartment Stairs: 2 story house with basement but doesn't have to do the steps Has following equipment at home: Vannie - 2 wheeled  OCCUPATION: Retired  PLOF: Independent  PATIENT GOALS: Improve knee strength and mobility  NEXT MD VISIT: 2 week f/u  OBJECTIVE:  Note: Objective measures were completed at Evaluation unless otherwise noted.  DIAGNOSTIC FINDINGS: n/a  PATIENT SURVEYS:  LEFS  Extreme difficulty/unable (0), Quite a bit of difficulty (1), Moderate difficulty (2), Little difficulty (3), No difficulty (4) Survey date:  07/03/24  Any of your usual work, housework or  school activities 1  2. Usual hobbies, recreational or sporting activities 1  3. Getting into/out of the bath 3  4. Walking between rooms 3  5. Putting on socks/shoes 3  6. Squatting  0  7. Lifting an object, like a bag of groceries from the floor 0  8. Performing light activities around your home 1  9. Performing heavy activities around your home 0  10. Getting into/out of a car 2  11. Walking 2 blocks 0  12. Walking 1 mile 0  13. Going up/down 10 stairs (1 flight) 0  14. Standing for 1 hour 0  15.  sitting for 1 hour 1  16. Running on even ground 0  17. Running on  uneven ground 0  18. Making sharp turns while running fast 0  19. Hopping  0  20. Rolling over in bed 0  Score total:  15/80     COGNITION: Overall cognitive status: Within functional limits for tasks assessed     SENSATION: WFL  EDEMA:  40 cm on R, 44 cm on L circumferential  MUSCLE LENGTH: Did not assess  POSTURE: No Significant postural limitations and forward head  PALPATION: TTP L quad and anterior thigh  LOWER EXTREMITY ROM:   ROM Right eval Left eval L 07/07/24 L 07/12/24 L 07/20/24  Hip flexion       Hip extension       Hip abduction       Hip adduction       Hip internal rotation       Hip external rotation       Knee flexion 115 AROM 100 AROM 108 AAROM  105 - seated 110 supine 120  Knee extension -5 LAQ -40 LAQ -12 PROM 20 LAQ 15 LAQ   Ankle dorsiflexion       Ankle plantarflexion       Ankle inversion       Ankle eversion        (Blank rows = not tested)  LOWER EXTREMITY MMT:  MMT Right eval Left eval  Hip flexion 5 3-  Hip extension    Hip abduction    Hip adduction    Hip internal rotation    Hip external rotation    Knee flexion 5 4  Knee extension 5 3-  Ankle dorsiflexion    Ankle plantarflexion    Ankle inversion    Ankle eversion     (Blank rows = not tested)  LOWER EXTREMITY SPECIAL TESTS:  Did not assess  FUNCTIONAL TESTS:  5 times sit to stand: 11.22 sec with UE use/support TUG: 23.65 sec  GAIT: Distance walked: Into clinic Assistive device utilized: Walker - 2 wheeled Level of assistance: Modified independence Comments: Flexed trunk, slightly antalgic  TREATMENT DATE:  07/20/24 Bike x 8 min Seated LAQ 3lb x 20- crepitus in patella Seated LAQ + ball squeeze x 20  Seated HS curls blue TB x 20 SLR L + QS x 20 Supine L HS curl/knee flexion pball x 20 S/L L clamshells 1lb weight x 20 Vaso to L  knee x 10 min for edema and pain  07/17/24 Bike L1 x 8 min Step ups 4' x 20 RLE 1HA Lateral step ups 4' x 20 RLE HA Step downs RLE 2' x 10 Seated LAQ BLE 3lb x 20 Seated HS curl GTB x 20 BLE Checked patellar mobility- WNL all directions SLR x 10 LLE  07/14/24 THERAPEUTIC ACTIVITIES: To improve functional performance.  Step ups 4' LLE x 20 - 1HA Lateral step ups 4' LLE x 20 - HA Standing GTB TKE 10x5  THERAPEUTIC EXERCISE: To improve strength, endurance, ROM, and flexibility.  Bike x 5 min Seated LLE LAQ 2lb 2x10 Seated LLE HS curl GTB 2x10  07/12/24 THERAPEUTIC EXERCISE: To improve strength, endurance, ROM, and flexibility.  Recumbent Bike x 6 min no resistance Supine L SLR + QS 2x10 Supine quad sets Knee ROM measurement THERAPEUTIC ACTIVITIES: To improve functional performance.  Clock balance L LE 12 to 6 o'clock x 5 Step ups on 2' step x 20 2 HA Lateral step ups on 2' step x 20 2 HA Standing hip abduction 2 x 10 2lb Standing hip extension 2 x 10 2lb Standing marching x 10 2lb  Vaso to L knee x 10 min for edema and pain  07/10/24 NEUROMUSCULAR RE-EDUCATION: To improve kinesthesia and proprioception.  Standing heel/toe raises x 20 Retro step x 20 Knee flexion stretch 6' step x 20; added lateral movement x 20 Mini squat 2x10 - using mirror to even out weight Supine QS 10x5 - pushing into therapist hand  THERAPEUTIC EXERCISE: To improve strength, endurance and ROM.  Rec Bike x 6 min full rev Seated L HS curls RTB x 10 Seated L SAQ x 15 - cues to avoid using hip flexors Seated hip up and over L hip x 10  GAIT TRAINING: To normalize gait pattern and improve safety.  W/o AD- cues for increased R step length and L stance time, cues for heel strike   07/07/24 NEUROMUSCULAR RE-EDUCATION: To improve  kinesthesia and proprioception.  Retro step x 10 with cane Knee flexion lunge stretch x 20 4' Standing heel/toe raises 2 x 10 B Mini squats x 10 SLR + QS x 5 QS pushing  into therapist hand- cues to avoid using glutes and curling toes  THERAPEUTIC EXERCISE: To improve strength, endurance, and ROM  Nustep L6x57min UE/LE Seated L SAQ 2x10 with assistance Seated lumbar flexion stretch green pball x1 min  GAIT TRAINING: To normalize gait pattern and improve safety.  With [facility] cues to increase stride length, heel/toe pattern, adjusted AD a notch lower  MANUAL THERAPY: To promote normalized muscle tension and for pain modulation  STM to R lumbar paraspinals 07/05/24 Nustep L6 x 8 min UEs/LEs Seated hamstring stretch x 30 Seated quad set 2x10 Seated SLR with hip abd x10 each knees straight and then bent Seated LAQ AAROM x10 Seated hamstring curl AAROM x10 Standing heel/toe raise x30 Standing hip abd 2x10 red TB Standing hip ext 2x10 red TB Gait training: 180' no a/d, working on hip extension and equal step length to decrease antalgic gait pattern. Able to perform with slowed gait speed   07/03/24 See HEP below  PATIENT EDUCATION:  Education details: HEP Person educated: Patient Education method: Programmer, multimedia, Facilities manager, and Handouts Education comprehension: verbalized understanding, returned demonstration, and needs further education  HOME EXERCISE PROGRAM: Access Code: [email or web address] URL: [email or web address] Date: 07/03/2024 Prepared by: Gellen April Earnie Starring  Exercises - Supine Heel Slide with Strap  - 2 x daily - 7 x weekly - 1 sets - 10 reps - Supine Quad Set on Towel Roll  - 2-3 x daily - 7 x weekly - 2 sets - 10 reps - Heel Toe Raises with Counter Support  - 1 x daily - 7 x weekly - 2 sets - 10 reps - Standing Hip Abduction with Counter Support  - 1 x daily - 7 x weekly - 2 sets - 10 reps - Standing Hip Extension with Counter Support  - 1 x daily - 7 x weekly - 2 sets - 10 reps - Standing Hamstring Curl with Chair Support  - 1 x daily - 7 x weekly - 2 sets - 10 reps  ASSESSMENT:  CLINICAL IMPRESSION:  Pt responded well  to treatment despite c/o pain in L knee. Focused on NWB exercises for edema and pain control. Good control with SLR. Pt with no complaints after session. Achieved 120 deg of knee flexion post session. She would benefit from continued skilled therapy.  From eval: Patient is a 73 y.o. F who was seen today for physical therapy evaluation and treatment for L TKA on 06/27/24. Assessment is significant for reduced L knee ROM, strength, and stability affecting transfers and amb for home and community mobility. Pt will benefit from PT to address these deficits to maximize her level of function.   OBJECTIVE IMPAIRMENTS: Abnormal gait, decreased activity tolerance, decreased balance, decreased coordination, decreased endurance, decreased mobility, difficulty walking, decreased ROM, decreased strength, hypomobility, increased edema, increased fascial restrictions, increased muscle spasms, improper body mechanics, postural dysfunction, and pain.   ACTIVITY LIMITATIONS: carrying, lifting, bending, sitting, standing, squatting, sleeping, stairs, transfers, bed mobility, bathing, toileting, dressing, hygiene/grooming, locomotion level, and caring for others  PARTICIPATION LIMITATIONS: meal prep, cleaning, laundry, driving, shopping, community activity, and yard work  PERSONAL FACTORS: Age, Fitness, Past/current experiences, and Time since onset of injury/illness/exacerbation are also affecting patient's functional outcome.   REHAB POTENTIAL: Good  CLINICAL DECISION MAKING: Evolving/moderate complexity  EVALUATION COMPLEXITY: Moderate   GOALS: Goals reviewed with patient? Yes  SHORT TERM GOALS: Target date: 08/07/2024  Pt will be ind with initial HEP Baseline: Goal status: INITIAL  2.  Pt will demo L knee ROM from 5 to 115 deg Baseline:  Goal status: 07/07/24- see AROM chart  3.  Pt will report reduction in pain by >/=50% Baseline:  Goal status: INITIAL  LONG TERM GOALS: Target date:  09/11/2024   Pt will be ind with management and progression of HEP Baseline:  Goal status: INITIAL  2.  Pt will demo L knee ROM from 0 to at least 115 deg for stair negotiation Baseline:  Goal status: INITIAL  3.  Pt will be able to ascend/descend steps in a reciprocal fashion to demo increased quad control and strength Baseline:  Goal status: INITIAL  4.  Pt will have improved TUG to </=13 sec without a/d Baseline:  Goal status: INITIAL  5.  Pt will be able to amb >/=1000' independently for community mobility Baseline:  Goal status: INITIAL  6.  Pt will have improved LEFS to >/=24/80 to demo MCID Baseline: 15 Goal status: INITIAL   PLAN:  PT FREQUENCY: 2x/week  PT DURATION: 10 weeks  PLANNED INTERVENTIONS: 97164- PT Re-evaluation, 97750- Physical Performance Testing, 97110-Therapeutic exercises, 97530- Therapeutic activity, [id number]- Neuromuscular re-education, 97535- Self Care, [id number]- Manual therapy, [phone]- Gait training, [phone]- Aquatic Therapy, [phone]- Electrical stimulation (unattended), 97016- Vasopneumatic device, [id number]- Ultrasound, [id number]- Ionotophoresis 4mg /ml Dexamethasone , 79439 (1-2 muscles), [id number] (3+ muscles)- Dry Needling, Patient/Family education, Balance training, Stair training, Taping, Joint mobilization, Spinal mobilization, Cryotherapy, and Moist heat  PLAN FOR NEXT SESSION: Work on Retail buyer. Knee ROM.    Estaban [place] L Nolah Krenzer, PTA 07/20/2024, 2:51 PM

## 2024-07-24 ENCOUNTER — Ambulatory Visit: Admitting: Rehabilitation

## 2024-07-24 DIAGNOSIS — R2689 Other abnormalities of gait and mobility: Secondary | ICD-10-CM

## 2024-07-24 DIAGNOSIS — G8929 Other chronic pain: Secondary | ICD-10-CM

## 2024-07-24 DIAGNOSIS — M25562 Pain in left knee: Secondary | ICD-10-CM | POA: Diagnosis not present

## 2024-07-24 DIAGNOSIS — R600 Localized edema: Secondary | ICD-10-CM

## 2024-07-24 DIAGNOSIS — M6281 Muscle weakness (generalized): Secondary | ICD-10-CM

## 2024-07-24 DIAGNOSIS — M25662 Stiffness of left knee, not elsewhere classified: Secondary | ICD-10-CM

## 2024-07-24 DIAGNOSIS — M62838 Other muscle spasm: Secondary | ICD-10-CM

## 2024-07-24 NOTE — Therapy (Signed)
 OUTPATIENT PHYSICAL THERAPY LOWER EXTREMITY TREATMENT   Patient Name: Kanesha Cadle MRN: [id number] DOB:02-Apr-1951, 73 y.o., female Today's Date: 07/24/2024  END OF SESSION:  PT End of Session - 07/24/24 1404     Visit Number 9    Number of Visits 20    Date for PT Re-Evaluation 09/11/24    Authorization Type [employer]    Progress Note Due on Visit 10    PT Start Time 1400    PT Stop Time 1501    PT Time Calculation (min) 61 min    Activity Tolerance Patient tolerated treatment well    Behavior During Therapy WFL for tasks assessed/performed                Past Medical History:  Diagnosis Date   Arthritis    Chronic kidney disease    stage 3   Heart murmur    Hypertension    Stroke [facility])    Past Surgical History:  Procedure Laterality Date   ABDOMINAL HYSTERECTOMY     ANTERIOR CERVICAL DECOMP/DISCECTOMY FUSION     ANTERIOR CRUCIATE LIGAMENT REPAIR Left    ARTHROPLASTY Bilateral    second toe   BACK SURGERY     BRAIN SURGERY     BUNIONECTOMY Bilateral    TOTAL KNEE ARTHROPLASTY Left 06/27/2024   Procedure: ARTHROPLASTY, KNEE, TOTAL;  Surgeon: Ernie Cough, MD;  Location: [facility];  Service: Orthopedics;  Laterality: Left;   Patient Active Problem List   Diagnosis Date Noted   S/P total knee arthroplasty, left 06/27/2024   S/P total knee replacement, left 06/27/2024    PCP: Andrew Truman GRADE., MD  REFERRING PROVIDER: Patti Rosina SAUNDERS, PA-C  REFERRING DIAG: [phone] (ICD-10-CM) - Unilateral primary osteoarthritis, left knee  THERAPY DIAG:  Chronic pain of left knee  Stiffness of left knee, not elsewhere classified  Muscle weakness (generalized)  Localized edema  Other muscle spasm  Other abnormalities of gait and mobility  Rationale for Evaluation and Treatment: Rehabilitation  ONSET DATE: 06/27/24 L TKA  SUBJECTIVE:   SUBJECTIVE STATEMENT:  Patient states feeling better now.  Still having pain, but did not take opiods prior to PT  today.  Only has 2 left and is hoping to be done with them now.   Just using Tylenol .   From eval: Pt is now back at home and everything has been okay. I've been getting around real good. I don't always use the walker. Pt states her balance has been good. Has been icing as much as she can. Has been walking for exercise at the house at least every hour. Has been keeping up with her pain medication every 4 hours.   PERTINENT HISTORY: Back fusion (reports chronic foot drop), ACDF   PAIN:  Are you having pain? Yes: NPRS scale: 5/10 Pain location: L knee Pain description: tight Aggravating factors: Standing prolonged time Relieving factors: Ice, pain medication  PRECAUTIONS: None  RED FLAGS: None   WEIGHT BEARING RESTRICTIONS: No  FALLS:  Has patient fallen in last 6 months? No  LIVING ENVIRONMENT: Lives with: lives with their spouse Lives in: House/apartment Stairs: 2 story house with basement but doesn't have to do the steps Has following equipment at home: Vannie - 2 wheeled  OCCUPATION: Retired  PLOF: Independent  PATIENT GOALS: Improve knee strength and mobility  NEXT MD VISIT: 2 week f/u  OBJECTIVE:  Note: Objective measures were completed at Evaluation unless otherwise noted.  DIAGNOSTIC FINDINGS: n/a  PATIENT SURVEYS:  LEFS  Extreme difficulty/unable (0), Quite a bit of difficulty (1), Moderate difficulty (2), Little difficulty (3), No difficulty (4) Survey date:  07/03/24  Any of your usual work, housework or school activities 1  2. Usual hobbies, recreational or sporting activities 1  3. Getting into/out of the bath 3  4. Walking between rooms 3  5. Putting on socks/shoes 3  6. Squatting  0  7. Lifting an object, like a bag of groceries from the floor 0  8. Performing light activities around your home 1  9. Performing heavy activities around your home 0  10. Getting into/out of a car 2  11. Walking 2 blocks 0  12. Walking 1 mile 0  13. Going up/down 10  stairs (1 flight) 0  14. Standing for 1 hour 0  15.  sitting for 1 hour 1  16. Running on even ground 0  17. Running on uneven ground 0  18. Making sharp turns while running fast 0  19. Hopping  0  20. Rolling over in bed 0  Score total:  15/80     COGNITION: Overall cognitive status: Within functional limits for tasks assessed     SENSATION: WFL  EDEMA:  40 cm on R, 44 cm on L circumferential  MUSCLE LENGTH: Did not assess  POSTURE: No Significant postural limitations and forward head  PALPATION: TTP L quad and anterior thigh  LOWER EXTREMITY ROM:   ROM Right eval Left eval L 07/07/24 L 07/12/24 L 07/20/24  Hip flexion       Hip extension       Hip abduction       Hip adduction       Hip internal rotation       Hip external rotation       Knee flexion 115 AROM 100 AROM 108 AAROM  105 - seated 110 supine 120  Knee extension -5 LAQ -40 LAQ -12 PROM 20 LAQ 15 LAQ   Ankle dorsiflexion       Ankle plantarflexion       Ankle inversion       Ankle eversion        (Blank rows = not tested)  LOWER EXTREMITY MMT:  MMT Right eval Left eval  Hip flexion 5 3-  Hip extension    Hip abduction    Hip adduction    Hip internal rotation    Hip external rotation    Knee flexion 5 4  Knee extension 5 3-  Ankle dorsiflexion    Ankle plantarflexion    Ankle inversion    Ankle eversion     (Blank rows = not tested)  LOWER EXTREMITY SPECIAL TESTS:  Did not assess  FUNCTIONAL TESTS:  5 times sit to stand: 11.22 sec with UE use/support TUG: 23.65 sec  GAIT: Distance walked: Into clinic Assistive device utilized: Walker - 2 wheeled Level of assistance: Modified independence Comments: Flexed trunk, slightly antalgic  TREATMENT DATE:  07/24/24 THERAPEUTIC EXERCISE: To improve ROM.  Demonstration, verbal and tactile cues throughout for  technique. Bike full ROM x 8 min  THERAPEUTIC ACTIVITIES: To improve functional performance.  Demonstration, verbal and tactile cues throughout for technique. 4 step ups F x 10; lateral x 10;  Aerobic step standing calf stretch x 1' x 2 LLE Aerobic step lunges x 10 no UE support Sit n slide flexion stretch x 1' x 2 LLE Sit n prop ext stretch x 2' LLE  NEUROMUSCULAR RE-EDUCATION: To improve balance. Sidestepping at counter GTB x 3 laps Box stepping x 4 CW; x4 CCW Pool noodle step overs (purple) F/B x 10;  S/S x 10 Tandem stance x 1' ea BLE Tandem gait F/B at counter x 3 laps  Vasopneumatic compression to L knee x 15' at moderate compression and 34 deg temp.  07/20/24 Bike x 8 min Seated LAQ 3lb x 20- crepitus in patella Seated LAQ + ball squeeze x 20  Seated HS curls blue TB x 20 SLR L + QS x 20 Supine L HS curl/knee flexion pball x 20 S/L L clamshells 1lb weight x 20 Vaso to L knee x 10 min for edema and pain  07/17/24 Bike L1 x 8 min Step ups 4' x 20 RLE 1HA Lateral step ups 4' x 20 RLE HA Step downs RLE 2' x 10 Seated LAQ BLE 3lb x 20 Seated HS curl GTB x 20 BLE Checked patellar mobility- WNL all directions SLR x 10 LLE  07/14/24 THERAPEUTIC ACTIVITIES: To improve functional performance.  Step ups 4' LLE x 20 - 1HA Lateral step ups 4' LLE x 20 - HA Standing GTB TKE 10x5  THERAPEUTIC EXERCISE: To improve strength, endurance, ROM, and flexibility.  Bike x 5 min Seated LLE LAQ 2lb 2x10 Seated LLE HS curl GTB 2x10  07/12/24 THERAPEUTIC EXERCISE: To improve strength, endurance, ROM, and flexibility.  Recumbent Bike x 6 min no resistance Supine L SLR + QS 2x10 Supine quad sets Knee ROM measurement THERAPEUTIC ACTIVITIES: To improve functional performance.  Clock balance L LE 12 to 6 o'clock x 5 Step ups on 2' step x 20 2 HA Lateral step ups on 2' step x 20 2 HA Standing hip abduction 2 x 10 2lb Standing hip extension 2 x 10 2lb Standing marching x 10 2lb  Vaso  to L knee x 10 min for edema and pain  07/10/24 NEUROMUSCULAR RE-EDUCATION: To improve kinesthesia and proprioception.  Standing heel/toe raises x 20 Retro step x 20 Knee flexion stretch 6' step x 20; added lateral movement x 20 Mini squat 2x10 - using mirror to even out weight Supine QS 10x5 - pushing into therapist hand  THERAPEUTIC EXERCISE: To improve strength, endurance and ROM.  Rec Bike x 6 min full rev Seated L HS curls RTB x 10 Seated L SAQ x 15 - cues to avoid using hip flexors Seated hip up and over L hip x 10  GAIT TRAINING: To normalize gait pattern and improve safety.  W/o AD- cues for increased R step length and L stance time, cues for heel strike   07/07/24 NEUROMUSCULAR RE-EDUCATION: To improve  kinesthesia and proprioception.  Retro step x 10 with cane Knee flexion lunge stretch x 20 4' Standing heel/toe raises 2 x 10 B Mini squats x 10 SLR + QS x 5 QS pushing into therapist hand- cues to avoid using glutes and curling toes  THERAPEUTIC EXERCISE: To improve strength, endurance, and ROM  Nustep L6x35min UE/LE Seated L SAQ 2x10 with assistance Seated lumbar flexion stretch green pball x1 min  GAIT TRAINING: To normalize gait pattern and improve safety.  With [facility] cues to increase stride length, heel/toe pattern, adjusted AD a notch lower  MANUAL THERAPY: To promote normalized muscle tension and for pain modulation  STM to R lumbar paraspinals 07/05/24 Nustep L6 x 8 min UEs/LEs Seated hamstring stretch x 30 Seated quad set 2x10 Seated SLR with hip abd x10 each knees straight and then bent Seated LAQ AAROM x10 Seated hamstring curl AAROM x10 Standing heel/toe raise x30 Standing hip abd 2x10 red TB Standing hip ext 2x10 red TB Gait training: 180' no a/d, working on hip extension and equal step length to decrease antalgic gait pattern. Able to perform with slowed gait speed   07/03/24 See HEP below    PATIENT EDUCATION:  Education details: HEP Person  educated: Patient Education method: Explanation, Demonstration, and Handouts Education comprehension: verbalized understanding, returned demonstration, and needs further education  HOME EXERCISE PROGRAM: Access Code: [email or web address] URL: [email or web address] Date: 07/03/2024 Prepared by: Gellen April Earnie Starring  Exercises - Supine Heel Slide with Strap  - 2 x daily - 7 x weekly - 1 sets - 10 reps - Supine Quad Set on Towel Roll  - 2-3 x daily - 7 x weekly - 2 sets - 10 reps - Heel Toe Raises with Counter Support  - 1 x daily - 7 x weekly - 2 sets - 10 reps - Standing Hip Abduction with Counter Support  - 1 x daily - 7 x weekly - 2 sets - 10 reps - Standing Hip Extension with Counter Support  - 1 x daily - 7 x weekly - 2 sets - 10 reps - Standing Hamstring Curl with Chair Support  - 1 x daily - 7 x weekly - 2 sets - 10 reps  ASSESSMENT:  CLINICAL IMPRESSION: 7/28 Making good progress with gait and balance.   Focus today on progressing dynamic balance activities for community stability.  Pain, ROM, and edema are continuing to slowly improve.   Quad set is still weak and tremulous  From eval: Patient is a 73 y.o. F who was seen today for physical therapy evaluation and treatment for L TKA on 06/27/24. Assessment is significant for reduced L knee ROM, strength, and stability affecting transfers and amb for home and community mobility. Pt will benefit from PT to address these deficits to maximize her level of function.   OBJECTIVE IMPAIRMENTS: Abnormal gait, decreased activity tolerance, decreased balance, decreased coordination, decreased endurance, decreased mobility, difficulty walking, decreased ROM, decreased strength, hypomobility, increased edema, increased fascial restrictions, increased muscle spasms, improper body mechanics, postural dysfunction, and pain.   ACTIVITY LIMITATIONS: carrying, lifting, bending, sitting, standing, squatting, sleeping, stairs, transfers, bed  mobility, bathing, toileting, dressing, hygiene/grooming, locomotion level, and caring for others  PARTICIPATION LIMITATIONS: meal prep, cleaning, laundry, driving, shopping, community activity, and yard work  PERSONAL FACTORS: Age, Fitness, Past/current experiences, and Time since onset of injury/illness/exacerbation are also affecting patient's functional outcome.   REHAB POTENTIAL: Good  CLINICAL DECISION MAKING: Evolving/moderate complexity  EVALUATION COMPLEXITY: Moderate   GOALS: Goals reviewed with patient? Yes  SHORT TERM GOALS: Target date: 08/07/2024  Pt will be ind with initial HEP Baseline: Goal status: INITIAL  2.  Pt will demo L knee ROM from 5 to 115 deg Baseline:  Goal status: 07/07/24- see AROM chart  3.  Pt will report reduction in pain by >/=50% Baseline:  Goal status: INITIAL  LONG TERM GOALS: Target date: 09/11/2024   Pt will be ind with management and progression of HEP Baseline:  Goal status: INITIAL  2.  Pt will demo L knee ROM from 0 to at least 115 deg for stair negotiation Baseline:  Goal status: INITIAL  3.  Pt will be able to ascend/descend steps in a reciprocal fashion to demo increased quad control and strength Baseline:  Goal status: INITIAL  4.  Pt will have improved TUG to </=13 sec without a/d Baseline:  Goal status: INITIAL  5.  Pt will be able to amb >/=1000' independently for community mobility Baseline:  Goal status: INITIAL  6.  Pt will have improved LEFS to >/=24/80 to demo MCID Baseline: 15 Goal status: INITIAL   PLAN:  PT FREQUENCY: 2x/week  PT DURATION: 10 weeks  PLANNED INTERVENTIONS: 97164- PT Re-evaluation, 97750- Physical Performance Testing, 97110-Therapeutic exercises, 97530- Therapeutic activity, 97112- Neuromuscular re-education, 97535- Self Care, [id number]- Manual therapy, [phone]- Gait training, [phone]- Aquatic Therapy, [phone]- Electrical stimulation (unattended), 97016- Vasopneumatic device, [id number]- Ultrasound,  [id number]- Ionotophoresis 4mg /ml Dexamethasone , 79439 (1-2 muscles), [id number] (3+ muscles)- Dry Needling, Patient/Family education, Balance training, Stair training, Taping, Joint mobilization, Spinal mobilization, Cryotherapy, and Moist heat  PLAN FOR NEXT SESSION: Continue with dynamic balance activities, quad activation due to still weak with quad sets  Perry Brucato, PT 07/24/2024, 9:41 PM

## 2024-07-27 ENCOUNTER — Ambulatory Visit: Admitting: Rehabilitation

## 2024-07-27 DIAGNOSIS — G8929 Other chronic pain: Secondary | ICD-10-CM

## 2024-07-27 DIAGNOSIS — R600 Localized edema: Secondary | ICD-10-CM

## 2024-07-27 DIAGNOSIS — M25662 Stiffness of left knee, not elsewhere classified: Secondary | ICD-10-CM

## 2024-07-27 DIAGNOSIS — M6281 Muscle weakness (generalized): Secondary | ICD-10-CM

## 2024-07-27 DIAGNOSIS — R2689 Other abnormalities of gait and mobility: Secondary | ICD-10-CM

## 2024-07-27 DIAGNOSIS — M25562 Pain in left knee: Secondary | ICD-10-CM | POA: Diagnosis not present

## 2024-07-27 DIAGNOSIS — M62838 Other muscle spasm: Secondary | ICD-10-CM

## 2024-07-27 NOTE — Therapy (Signed)
 OUTPATIENT PHYSICAL THERAPY LOWER EXTREMITY TREATMENT   Patient Name: Whitney Hess MRN: [id number] DOB:15-Aug-1951, 73 y.o., female Today's Date: 07/27/2024  END OF SESSION:  PT End of Session - 07/27/24 1407     Visit Number 10    Number of Visits 20    Date for PT Re-Evaluation 09/11/24    Authorization Type [employer]    Progress Note Due on Visit 20    PT Start Time 1405    PT Stop Time 1500    PT Time Calculation (min) 55 min    Activity Tolerance Patient tolerated treatment well    Behavior During Therapy WFL for tasks assessed/performed                Past Medical History:  Diagnosis Date   Arthritis    Chronic kidney disease    stage 3   Heart murmur    Hypertension    Stroke [facility])    Past Surgical History:  Procedure Laterality Date   ABDOMINAL HYSTERECTOMY     ANTERIOR CERVICAL DECOMP/DISCECTOMY FUSION     ANTERIOR CRUCIATE LIGAMENT REPAIR Left    ARTHROPLASTY Bilateral    second toe   BACK SURGERY     BRAIN SURGERY     BUNIONECTOMY Bilateral    TOTAL KNEE ARTHROPLASTY Left 06/27/2024   Procedure: ARTHROPLASTY, KNEE, TOTAL;  Surgeon: Ernie Cough, MD;  Location: [facility];  Service: Orthopedics;  Laterality: Left;   Patient Active Problem List   Diagnosis Date Noted   S/P total knee arthroplasty, left 06/27/2024   S/P total knee replacement, left 06/27/2024    PCP: Andrew Truman GRADE., MD  REFERRING PROVIDER: Patti Rosina SAUNDERS, PA-C  REFERRING DIAG: [phone] (ICD-10-CM) - Unilateral primary osteoarthritis, left knee  THERAPY DIAG:  Chronic pain of left knee  Stiffness of left knee, not elsewhere classified  Muscle weakness (generalized)  Localized edema  Other muscle spasm  Other abnormalities of gait and mobility  Rationale for Evaluation and Treatment: Rehabilitation  ONSET DATE: 06/27/24 L TKA  SUBJECTIVE:   SUBJECTIVE STATEMENT: States hurting more today.  Has run out of opiods and only taking Tyelnol.   States not  sleeping well at all.   Advised to try changing to recliner 1/2 way thru the night.   From eval: Pt is now back at home and everything has been okay. I've been getting around real good. I don't always use the walker. Pt states her balance has been good. Has been icing as much as she can. Has been walking for exercise at the house at least every hour. Has been keeping up with her pain medication every 4 hours.   PERTINENT HISTORY: Back fusion (reports chronic foot drop), ACDF   PAIN:  Are you having pain? Yes: NPRS scale: 5/10 Pain location: L knee Pain description: tight Aggravating factors: Standing prolonged time Relieving factors: Ice, pain medication  PRECAUTIONS: None  RED FLAGS: None   WEIGHT BEARING RESTRICTIONS: No  FALLS:  Has patient fallen in last 6 months? No  LIVING ENVIRONMENT: Lives with: lives with their spouse Lives in: House/apartment Stairs: 2 story house with basement but doesn't have to do the steps Has following equipment at home: Vannie - 2 wheeled  OCCUPATION: Retired  PLOF: Independent  PATIENT GOALS: Improve knee strength and mobility  NEXT MD VISIT: 2 week f/u  OBJECTIVE:  Note: Objective measures were completed at Evaluation unless otherwise noted.  DIAGNOSTIC FINDINGS: n/a  PATIENT SURVEYS:  LEFS  Extreme difficulty/unable (0),  Quite a bit of difficulty (1), Moderate difficulty (2), Little difficulty (3), No difficulty (4) Survey date:  07/03/24  Any of your usual work, housework or school activities 1  2. Usual hobbies, recreational or sporting activities 1  3. Getting into/out of the bath 3  4. Walking between rooms 3  5. Putting on socks/shoes 3  6. Squatting  0  7. Lifting an object, like a bag of groceries from the floor 0  8. Performing light activities around your home 1  9. Performing heavy activities around your home 0  10. Getting into/out of a car 2  11. Walking 2 blocks 0  12. Walking 1 mile 0  13. Going up/down 10  stairs (1 flight) 0  14. Standing for 1 hour 0  15.  sitting for 1 hour 1  16. Running on even ground 0  17. Running on uneven ground 0  18. Making sharp turns while running fast 0  19. Hopping  0  20. Rolling over in bed 0  Score total:  15/80     COGNITION: Overall cognitive status: Within functional limits for tasks assessed     SENSATION: WFL  EDEMA:  40 cm on R, 44 cm on L circumferential  MUSCLE LENGTH: Did not assess  POSTURE: No Significant postural limitations and forward head  PALPATION: TTP L quad and anterior thigh  LOWER EXTREMITY ROM:   ROM Right eval Left eval L 07/07/24 L 07/12/24 L 07/20/24 L 07/27/24  Hip flexion        Hip extension        Hip abduction        Hip adduction        Hip internal rotation        Hip external rotation        Knee flexion 115 AROM 100 AROM 108 AAROM  105 - seated 110 supine 120 120  Knee extension -5 LAQ -40 LAQ -12 PROM 20 LAQ -15 LAQ  -5 in supine -10 deg LAQ  Ankle dorsiflexion        Ankle plantarflexion        Ankle inversion        Ankle eversion         (Blank rows = not tested)  LOWER EXTREMITY MMT:  MMT Right eval Left eval LLE 07/27/24  Hip flexion 5 3-   Hip extension     Hip abduction     Hip adduction     Hip internal rotation   3  Hip external rotation   4  Knee flexion 5 4 4+  Knee extension 5 3- 4-  Ankle dorsiflexion   5  Ankle plantarflexion     Ankle inversion     Ankle eversion      (Blank rows = not tested)  LOWER EXTREMITY SPECIAL TESTS:  Did not assess  FUNCTIONAL TESTS:  5 times sit to stand: 11.22 sec with UE use/support;  07/27/24 = 11.10 TUG: 23.65 sec on eval;    07/27/24 = 14.94 sec  GAIT: Distance walked: Into clinic Assistive device utilized: Walker - 2 wheeled Level of assistance: Modified independence Comments: Flexed trunk, slightly antalgic  TREATMENT DATE:  07/27/24 THERAPEUTIC EXERCISE: To improve ROM.  Demonstration, verbal and tactile cues throughout for technique. Bike full ROM L4 x 8 min  THERAPEUTIC ACTIVITIES: To improve functional performance.  Demonstration, verbal and tactile cues throughout for technique. TUG test redone 5x STS redone Stair training down/up 1 flight with rail in LUE, cane in RUE with reciprocal pattern and CGA for foot placement, quad  Contraction, balance maintenance 4# LAQ X 2/10 LLE 4# HIP ABD X 2/10 LLE 4# hip ext x 2/10 LLE 4# knee flexion x 2/10 LLE QS x 20 w/ 5 sec holds  VASOPNEUMATIC x 15' L knee at moderate compression  07/24/24 THERAPEUTIC EXERCISE: To improve ROM.  Demonstration, verbal and tactile cues throughout for technique. Bike full ROM x 8 min  THERAPEUTIC ACTIVITIES: To improve functional performance.  Demonstration, verbal and tactile cues throughout for technique. 4 step ups F x 10; lateral x 10;  Aerobic step standing calf stretch x 1' x 2 LLE Aerobic step lunges x 10 no UE support Sit n slide flexion stretch x 1' x 2 LLE Sit n prop ext stretch x 2' LLE  NEUROMUSCULAR RE-EDUCATION: To improve balance. Sidestepping at counter GTB x 3 laps Box stepping x 4 CW; x4 CCW Pool noodle step overs (purple) F/B x 10;  S/S x 10 Tandem stance x 1' ea BLE Tandem gait F/B at counter x 3 laps  Vasopneumatic compression to L knee x 15' at moderate compression and 34 deg temp.  07/20/24 Bike x 8 min Seated LAQ 3lb x 20- crepitus in patella Seated LAQ + ball squeeze x 20  Seated HS curls blue TB x 20 SLR L + QS x 20 Supine L HS curl/knee flexion pball x 20 S/L L clamshells 1lb weight x 20 Vaso to L knee x 10 min for edema and pain  07/17/24 Bike L1 x 8 min Step ups 4' x 20 RLE 1HA Lateral step ups 4' x 20 RLE HA Step downs RLE 2' x 10 Seated LAQ BLE 3lb x 20 Seated HS curl GTB x 20 BLE Checked patellar mobility- WNL all directions SLR x 10  LLE  07/14/24 THERAPEUTIC ACTIVITIES: To improve functional performance.  Step ups 4' LLE x 20 - 1HA Lateral step ups 4' LLE x 20 - HA Standing GTB TKE 10x5  THERAPEUTIC EXERCISE: To improve strength, endurance, ROM, and flexibility.  Bike x 5 min Seated LLE LAQ 2lb 2x10 Seated LLE HS curl GTB 2x10  07/12/24 THERAPEUTIC EXERCISE: To improve strength, endurance, ROM, and flexibility.  Recumbent Bike x 6 min no resistance Supine L SLR + QS 2x10 Supine quad sets Knee ROM measurement THERAPEUTIC ACTIVITIES: To improve functional performance.  Clock balance L LE 12 to 6 o'clock x 5 Step ups on 2' step x 20 2 HA Lateral step ups on 2' step x 20 2 HA Standing hip abduction 2 x 10 2lb Standing hip extension 2 x 10 2lb Standing marching x 10 2lb  Vaso to L knee x 10 min for edema and pain  07/10/24 NEUROMUSCULAR RE-EDUCATION: To improve kinesthesia and proprioception.  Standing heel/toe raises x 20 Retro step x 20 Knee flexion stretch 6' step x 20; added lateral movement x 20 Mini squat 2x10 - using mirror to even out weight Supine QS 10x5 - pushing into therapist hand  THERAPEUTIC EXERCISE: To improve strength, endurance and ROM.  Rec Bike x 6 min full rev Seated L HS curls RTB x 10 Seated L SAQ x  15 - cues to avoid using hip flexors Seated hip up and over L hip x 10  GAIT TRAINING: To normalize gait pattern and improve safety.  W/o AD- cues for increased R step length and L stance time, cues for heel strike   07/07/24 NEUROMUSCULAR RE-EDUCATION: To improve  kinesthesia and proprioception.  Retro step x 10 with cane Knee flexion lunge stretch x 20 4' Standing heel/toe raises 2 x 10 B Mini squats x 10 SLR + QS x 5 QS pushing into therapist hand- cues to avoid using glutes and curling toes  THERAPEUTIC EXERCISE: To improve strength, endurance, and ROM  Nustep L6x97min UE/LE Seated L SAQ 2x10 with assistance Seated lumbar flexion stretch green pball x1 min  GAIT TRAINING:  To normalize gait pattern and improve safety.  With [facility] cues to increase stride length, heel/toe pattern, adjusted AD a notch lower  MANUAL THERAPY: To promote normalized muscle tension and for pain modulation  STM to R lumbar paraspinals 07/05/24 Nustep L6 x 8 min UEs/LEs Seated hamstring stretch x 30 Seated quad set 2x10 Seated SLR with hip abd x10 each knees straight and then bent Seated LAQ AAROM x10 Seated hamstring curl AAROM x10 Standing heel/toe raise x30 Standing hip abd 2x10 red TB Standing hip ext 2x10 red TB Gait training: 180' no a/d, working on hip extension and equal step length to decrease antalgic gait pattern. Able to perform with slowed gait speed   07/03/24 See HEP below    PATIENT EDUCATION:  Education details: HEP Person educated: Patient Education method: Explanation, Demonstration, and Handouts Education comprehension: verbalized understanding, returned demonstration, and needs further education  HOME EXERCISE PROGRAM: Access Code: [email or web address] URL: [email or web address] Date: 07/03/2024 Prepared by: Gellen April Earnie Starring  Exercises - Supine Heel Slide with Strap  - 2 x daily - 7 x weekly - 1 sets - 10 reps - Supine Quad Set on Towel Roll  - 2-3 x daily - 7 x weekly - 2 sets - 10 reps - Heel Toe Raises with Counter Support  - 1 x daily - 7 x weekly - 2 sets - 10 reps - Standing Hip Abduction with Counter Support  - 1 x daily - 7 x weekly - 2 sets - 10 reps - Standing Hip Extension with Counter Support  - 1 x daily - 7 x weekly - 2 sets - 10 reps - Standing Hamstring Curl with Chair Support  - 1 x daily - 7 x weekly - 2 sets - 10 reps  ASSESSMENT:  CLINICAL IMPRESSION: 7/28 Making good progress with gait and balance.   Focus today on progressing dynamic balance activities for community stability.  Pain, ROM, and edema are continuing to slowly improve.   Quad set is still weak and tremulous  From eval: Patient is a 73 y.o. F who was seen  today for physical therapy evaluation and treatment for L TKA on 06/27/24. Assessment is significant for reduced L knee ROM, strength, and stability affecting transfers and amb for home and community mobility. Pt will benefit from PT to address these deficits to maximize her level of function.   OBJECTIVE IMPAIRMENTS: Abnormal gait, decreased activity tolerance, decreased balance, decreased coordination, decreased endurance, decreased mobility, difficulty walking, decreased ROM, decreased strength, hypomobility, increased edema, increased fascial restrictions, increased muscle spasms, improper body mechanics, postural dysfunction, and pain.   ACTIVITY LIMITATIONS: carrying, lifting, bending, sitting, standing, squatting, sleeping, stairs, transfers, bed mobility, bathing, toileting, dressing, hygiene/grooming, locomotion level, and caring for others  PARTICIPATION LIMITATIONS: meal prep, cleaning, laundry, driving, shopping, community activity, and yard work  PERSONAL FACTORS: Age, Fitness, Past/current experiences, and Time since onset of injury/illness/exacerbation are also affecting patient's functional outcome.   REHAB POTENTIAL: Good  CLINICAL DECISION MAKING: Evolving/moderate complexity  EVALUATION COMPLEXITY: Moderate   GOALS: Goals reviewed with patient? Yes  SHORT TERM GOALS: Target date: 08/07/2024  Pt will be ind with initial HEP Baseline: Goal status: MET  2.  Pt will demo L knee ROM from 5 to 115 deg Baseline: 07/27/24 = -5 to 120 deg Goal status: PARTIAL MET- see AROM chart  3.  Pt will report reduction in pain by >/=50% Baseline: 4-8/10 variable Goal status: IN PROGRESS  LONG TERM GOALS: Target date: 09/11/2024   Pt will be ind with management and progression of HEP Baseline: 07/27/24 needs assist for correct performance of balance exercises Goal status: IN PROGRESS  2.  Pt will demo L knee ROM from 0 to at least 115 deg for stair negotiation Baseline: 07/27/24 = -5  to 120 Goal status: IN PROGRESS  3.  Pt will be able to ascend/descend steps in a reciprocal fashion to demo increased quad control and strength Baseline: 07/27/24 1 flight with cane, rail, and CGA for reciprocal pattern Goal status: IN PROGRESS  4.  Pt will have improved TUG to </=13 sec without a/d Baseline: 07/27/24 = 14.94 Goal status: IN PROGRESS  5.  Pt will be able to amb >/=1000' independently for community mobility Baseline: 07/27/24- able to go to [facility] and complete 8 minutes of shopping Goal status: IN PROGRESS   6.  Pt will have improved LEFS to >/=24/80 to demo MCID Baseline: 15 07/27/24 = 41/80 Goal status: IN PROGRESS   PLAN:  PT FREQUENCY: 2x/week  PT DURATION: 10 weeks  PLANNED INTERVENTIONS: 97164- PT Re-evaluation, 97750- Physical Performance Testing, 97110-Therapeutic exercises, 97530- Therapeutic activity, 97112- Neuromuscular re-education, 97535- Self Care, 02859- Manual therapy, [phone]- Gait training, [phone]- Aquatic Therapy, [phone]- Electrical stimulation (unattended), 97016- Vasopneumatic device, [id number]- Ultrasound, [id number]- Ionotophoresis 4mg /ml Dexamethasone , 79439 (1-2 muscles), [id number] (3+ muscles)- Dry Needling, Patient/Family education, Balance training, Stair training, Taping, Joint mobilization, Spinal mobilization, Cryotherapy, and Moist heat  PLAN FOR NEXT SESSION: Continue with dynamic balance activities, QS, SAQ for quad activation; step ups; KT tape?  Addisen Chappelle, PT 07/27/2024, 3:07 PM

## 2024-07-31 ENCOUNTER — Ambulatory Visit: Attending: Student | Admitting: Rehabilitation

## 2024-07-31 ENCOUNTER — Encounter

## 2024-07-31 DIAGNOSIS — G8929 Other chronic pain: Secondary | ICD-10-CM | POA: Insufficient documentation

## 2024-07-31 DIAGNOSIS — M25562 Pain in left knee: Secondary | ICD-10-CM | POA: Diagnosis present

## 2024-07-31 DIAGNOSIS — R600 Localized edema: Secondary | ICD-10-CM | POA: Insufficient documentation

## 2024-07-31 DIAGNOSIS — M6281 Muscle weakness (generalized): Secondary | ICD-10-CM | POA: Diagnosis present

## 2024-07-31 DIAGNOSIS — M25662 Stiffness of left knee, not elsewhere classified: Secondary | ICD-10-CM | POA: Diagnosis present

## 2024-07-31 DIAGNOSIS — M62838 Other muscle spasm: Secondary | ICD-10-CM | POA: Diagnosis present

## 2024-07-31 DIAGNOSIS — R2689 Other abnormalities of gait and mobility: Secondary | ICD-10-CM | POA: Insufficient documentation

## 2024-07-31 NOTE — Therapy (Signed)
 OUTPATIENT PHYSICAL THERAPY LOWER EXTREMITY TREATMENT   Patient Name: Whitney Hess MRN: [id number] DOB:09/16/1951, 73 y.o., female Today's Date: 07/31/2024  END OF SESSION:  PT End of Session - 07/31/24 1408     Visit Number 11    Number of Visits 20    Date for PT Re-Evaluation 09/11/24    Authorization Type [employer]    Progress Note Due on Visit 20    PT Start Time 1401    PT Stop Time 1506    PT Time Calculation (min) 65 min    Activity Tolerance Patient tolerated treatment well    Behavior During Therapy WFL for tasks assessed/performed                Past Medical History:  Diagnosis Date   Arthritis    Chronic kidney disease    stage 3   Heart murmur    Hypertension    Stroke [facility])    Past Surgical History:  Procedure Laterality Date   ABDOMINAL HYSTERECTOMY     ANTERIOR CERVICAL DECOMP/DISCECTOMY FUSION     ANTERIOR CRUCIATE LIGAMENT REPAIR Left    ARTHROPLASTY Bilateral    second toe   BACK SURGERY     BRAIN SURGERY     BUNIONECTOMY Bilateral    TOTAL KNEE ARTHROPLASTY Left 06/27/2024   Procedure: ARTHROPLASTY, KNEE, TOTAL;  Surgeon: Ernie Cough, MD;  Location: [facility];  Service: Orthopedics;  Laterality: Left;   Patient Active Problem List   Diagnosis Date Noted   S/P total knee arthroplasty, left 06/27/2024   S/P total knee replacement, left 06/27/2024    PCP: Andrew Truman GRADE., MD  REFERRING PROVIDER: Patti Rosina SAUNDERS, PA-C  REFERRING DIAG: [id number] (ICD-10-CM) - Unilateral primary osteoarthritis, left knee  THERAPY DIAG:  Stiffness of left knee, not elsewhere classified  Muscle weakness (generalized)  Localized edema  Other muscle spasm  Other abnormalities of gait and mobility  Rationale for Evaluation and Treatment: Rehabilitation  ONSET DATE: 06/27/24 L TKA  SUBJECTIVE:   SUBJECTIVE STATEMENT: States feels a little better this week.  States working on doing steps at home.  Rates pain is 2/10 in the L knee  today.  From eval: Pt is now back at home and everything has been okay. I've been getting around real good. I don't always use the walker. Pt states her balance has been good. Has been icing as much as she can. Has been walking for exercise at the house at least every hour. Has been keeping up with her pain medication every 4 hours.   PERTINENT HISTORY: Back fusion (reports chronic foot drop), ACDF   PAIN:  Are you having pain? Yes: NPRS scale: 5/10 Pain location: L knee Pain description: tight Aggravating factors: Standing prolonged time Relieving factors: Ice, pain medication  PRECAUTIONS: None  RED FLAGS: None   WEIGHT BEARING RESTRICTIONS: No  FALLS:  Has patient fallen in last 6 months? No  LIVING ENVIRONMENT: Lives with: lives with their spouse Lives in: House/apartment Stairs: 2 story house with basement but doesn't have to do the steps Has following equipment at home: Vannie - 2 wheeled  OCCUPATION: Retired  PLOF: Independent  PATIENT GOALS: Improve knee strength and mobility  NEXT MD VISIT: 2 week f/u  OBJECTIVE:  Note: Objective measures were completed at Evaluation unless otherwise noted.  DIAGNOSTIC FINDINGS: n/a  PATIENT SURVEYS:  LEFS  Extreme difficulty/unable (0), Quite a bit of difficulty (1), Moderate difficulty (2), Little difficulty (3), No difficulty (4) Survey date:  07/03/24  Any of your usual work, housework or school activities 1  2. Usual hobbies, recreational or sporting activities 1  3. Getting into/out of the bath 3  4. Walking between rooms 3  5. Putting on socks/shoes 3  6. Squatting  0  7. Lifting an object, like a bag of groceries from the floor 0  8. Performing light activities around your home 1  9. Performing heavy activities around your home 0  10. Getting into/out of a car 2  11. Walking 2 blocks 0  12. Walking 1 mile 0  13. Going up/down 10 stairs (1 flight) 0  14. Standing for 1 hour 0  15.  sitting for 1 hour 1   16. Running on even ground 0  17. Running on uneven ground 0  18. Making sharp turns while running fast 0  19. Hopping  0  20. Rolling over in bed 0  Score total:  15/80     COGNITION: Overall cognitive status: Within functional limits for tasks assessed     SENSATION: WFL  EDEMA:  40 cm on R, 44 cm on L circumferential  MUSCLE LENGTH: Did not assess  POSTURE: No Significant postural limitations and forward head  PALPATION: TTP L quad and anterior thigh  LOWER EXTREMITY ROM:   ROM Right eval Left eval L 07/07/24 L 07/12/24 L 07/20/24 L 07/27/24  Hip flexion        Hip extension        Hip abduction        Hip adduction        Hip internal rotation        Hip external rotation        Knee flexion 115 AROM 100 AROM 108 AAROM  105 - seated 110 supine 120 120  Knee extension -5 LAQ -40 LAQ -12 PROM 20 LAQ -15 LAQ  -5 in supine -10 deg LAQ  Ankle dorsiflexion        Ankle plantarflexion        Ankle inversion        Ankle eversion         (Blank rows = not tested)  LOWER EXTREMITY MMT:  MMT Right eval Left eval LLE 07/27/24  Hip flexion 5 3-   Hip extension     Hip abduction     Hip adduction     Hip internal rotation   3  Hip external rotation   4  Knee flexion 5 4 4+  Knee extension 5 3- 4-  Ankle dorsiflexion   5  Ankle plantarflexion     Ankle inversion     Ankle eversion      (Blank rows = not tested)  LOWER EXTREMITY SPECIAL TESTS:  Did not assess  FUNCTIONAL TESTS:  5 times sit to stand: 11.22 sec with UE use/support;  07/27/24 = 11.10 TUG: 23.65 sec on eval;    07/27/24 = 14.94 sec  GAIT: Distance walked: Into clinic Assistive device utilized: Walker - 2 wheeled Level of assistance: Modified independence Comments: Flexed trunk, slightly antalgic  TREATMENT DATE:  07/31/24 THERAPEUTIC EXERCISE: To improve ROM.   Demonstration, verbal and tactile cues throughout for technique. Bike full ROM L2 x 8 min  THERAPEUTIC ACTIVITIES: To improve functional performance.  Demonstration, verbal and tactile cues throughout for technique. Seated LAQ 4# x 3/10 LLE Standing knee flexion 4# x 3/10 LLE Standing hip flexion 4# x 3/10 LLE Supine SKTC flexion stretch 4# x 30 sec x 3 w/ PT assist Supine SAQ 4# x 3/10 LLE Unilateral bridge x 20 LLE Supine ham stretch x 1' x 2 LLE Seated ham stretch x 1' x 2 LLE RDL to 9 stool 0# x 10 on BLE;  SLS RDL on LLE w/ RUE counter support x 10  NEUROMUSCULAR RE-EDUCATION: To improve balance Tandem stance x 1' EC each leg in front Tandem gait at counter w/ intermittent 1UE support F/B X 3 laps.  VASOPNEUMATIC x 15' L knee at moderate compression  07/27/24 THERAPEUTIC EXERCISE: To improve ROM.  Demonstration, verbal and tactile cues throughout for technique. Bike full ROM L4 x 8 min  THERAPEUTIC ACTIVITIES: To improve functional performance.  Demonstration, verbal and tactile cues throughout for technique. TUG test redone 5x STS redone Stair training down/up 1 flight with rail in LUE, cane in RUE with reciprocal pattern and CGA for foot placement, quad  Contraction, balance maintenance 4# LAQ X 2/10 LLE 4# HIP ABD X 2/10 LLE 4# hip ext x 2/10 LLE 4# knee flexion x 2/10 LLE QS x 20 w/ 5 sec holds  VASOPNEUMATIC x 15' L knee at moderate compression  07/24/24 THERAPEUTIC EXERCISE: To improve ROM.  Demonstration, verbal and tactile cues throughout for technique. Bike full ROM x 8 min  THERAPEUTIC ACTIVITIES: To improve functional performance.  Demonstration, verbal and tactile cues throughout for technique. 4 step ups F x 10; lateral x 10;  Aerobic step standing calf stretch x 1' x 2 LLE Aerobic step lunges x 10 no UE support Sit n slide flexion stretch x 1' x 2 LLE Sit n prop ext stretch x 2' LLE  NEUROMUSCULAR RE-EDUCATION: To improve balance. Sidestepping at  counter GTB x 3 laps Box stepping x 4 CW; x4 CCW Pool noodle step overs (purple) F/B x 10;  S/S x 10 Tandem stance x 1' ea BLE Tandem gait F/B at counter x 3 laps  Vasopneumatic compression to L knee x 15' at moderate compression and 34 deg temp.  07/20/24 Bike x 8 min Seated LAQ 3lb x 20- crepitus in patella Seated LAQ + ball squeeze x 20  Seated HS curls blue TB x 20 SLR L + QS x 20 Supine L HS curl/knee flexion pball x 20 S/L L clamshells 1lb weight x 20 Vaso to L knee x 10 min for edema and pain  07/17/24 Bike L1 x 8 min Step ups 4' x 20 RLE 1HA Lateral step ups 4' x 20 RLE HA Step downs RLE 2' x 10 Seated LAQ BLE 3lb x 20 Seated HS curl GTB x 20 BLE Checked patellar mobility- WNL all directions SLR x 10 LLE  07/14/24 THERAPEUTIC ACTIVITIES: To improve functional performance.  Step ups 4' LLE x 20 - 1HA Lateral step ups 4' LLE x 20 - HA Standing GTB TKE 10x5  THERAPEUTIC EXERCISE: To improve strength, endurance, ROM, and flexibility.  Bike x 5 min Seated LLE LAQ 2lb 2x10 Seated LLE HS curl GTB 2x10  07/12/24 THERAPEUTIC EXERCISE: To improve strength, endurance, ROM, and flexibility.  Recumbent Bike x 6 min no resistance  Supine L SLR + QS 2x10 Supine quad sets Knee ROM measurement THERAPEUTIC ACTIVITIES: To improve functional performance.  Clock balance L LE 12 to 6 o'clock x 5 Step ups on 2' step x 20 2 HA Lateral step ups on 2' step x 20 2 HA Standing hip abduction 2 x 10 2lb Standing hip extension 2 x 10 2lb Standing marching x 10 2lb  Vaso to L knee x 10 min for edema and pain  07/10/24 NEUROMUSCULAR RE-EDUCATION: To improve kinesthesia and proprioception.  Standing heel/toe raises x 20 Retro step x 20 Knee flexion stretch 6' step x 20; added lateral movement x 20 Mini squat 2x10 - using mirror to even out weight Supine QS 10x5 - pushing into therapist hand  THERAPEUTIC EXERCISE: To improve strength, endurance and ROM.  Rec Bike x 6 min full  rev Seated L HS curls RTB x 10 Seated L SAQ x 15 - cues to avoid using hip flexors Seated hip up and over L hip x 10  GAIT TRAINING: To normalize gait pattern and improve safety.  W/o AD- cues for increased R step length and L stance time, cues for heel strike   07/07/24 NEUROMUSCULAR RE-EDUCATION: To improve  kinesthesia and proprioception.  Retro step x 10 with cane Knee flexion lunge stretch x 20 4' Standing heel/toe raises 2 x 10 B Mini squats x 10 SLR + QS x 5 QS pushing into therapist hand- cues to avoid using glutes and curling toes  THERAPEUTIC EXERCISE: To improve strength, endurance, and ROM  Nustep L6x18min UE/LE Seated L SAQ 2x10 with assistance Seated lumbar flexion stretch green pball x1 min  GAIT TRAINING: To normalize gait pattern and improve safety.  With [facility] cues to increase stride length, heel/toe pattern, adjusted AD a notch lower  MANUAL THERAPY: To promote normalized muscle tension and for pain modulation  STM to R lumbar paraspinals 07/05/24 Nustep L6 x 8 min UEs/LEs Seated hamstring stretch x 30 Seated quad set 2x10 Seated SLR with hip abd x10 each knees straight and then bent Seated LAQ AAROM x10 Seated hamstring curl AAROM x10 Standing heel/toe raise x30 Standing hip abd 2x10 red TB Standing hip ext 2x10 red TB Gait training: 180' no a/d, working on hip extension and equal step length to decrease antalgic gait pattern. Able to perform with slowed gait speed   07/03/24 See HEP below    PATIENT EDUCATION:  Education details: HEP Person educated: Patient Education method: Explanation, Demonstration, and Handouts Education comprehension: verbalized understanding, returned demonstration, and needs further education  HOME EXERCISE PROGRAM: Access Code: [email or web address] URL: [email or web address] Date: 07/03/2024 Prepared by: Gellen April Earnie Starring  Exercises - Supine Heel Slide with Strap  - 2 x daily - 7 x weekly - 1 sets - 10 reps -  Supine Quad Set on Towel Roll  - 2-3 x daily - 7 x weekly - 2 sets - 10 reps - Heel Toe Raises with Counter Support  - 1 x daily - 7 x weekly - 2 sets - 10 reps - Standing Hip Abduction with Counter Support  - 1 x daily - 7 x weekly - 2 sets - 10 reps - Standing Hip Extension with Counter Support  - 1 x daily - 7 x weekly - 2 sets - 10 reps - Standing Hamstring Curl with Chair Support  - 1 x daily - 7 x weekly - 2 sets - 10 reps  ASSESSMENT:  CLINICAL IMPRESSION: 07/31/24:  Patient progressing with quad strength.  She is able to hold her LAQ position today better than last visit, but still tremulous in the R quad.  Focus today is also on hamstring strength and flexibility since she is c/o some popliteal and distal hamstring discomfort.  She needs continued work on balance due to unsteadiness with gait with no device and still having weakness in the R quad along with residual pain.    From eval: Patient is a 73 y.o. F who was seen today for physical therapy evaluation and treatment for L TKA on 06/27/24. Assessment is significant for reduced L knee ROM, strength, and stability affecting transfers and amb for home and community mobility. Pt will benefit from PT to address these deficits to maximize her level of function.   OBJECTIVE IMPAIRMENTS: Abnormal gait, decreased activity tolerance, decreased balance, decreased coordination, decreased endurance, decreased mobility, difficulty walking, decreased ROM, decreased strength, hypomobility, increased edema, increased fascial restrictions, increased muscle spasms, improper body mechanics, postural dysfunction, and pain.   ACTIVITY LIMITATIONS: carrying, lifting, bending, sitting, standing, squatting, sleeping, stairs, transfers, bed mobility, bathing, toileting, dressing, hygiene/grooming, locomotion level, and caring for others  PARTICIPATION LIMITATIONS: meal prep, cleaning, laundry, driving, shopping, community activity, and yard work  PERSONAL  FACTORS: Age, Fitness, Past/current experiences, and Time since onset of injury/illness/exacerbation are also affecting patient's functional outcome.   REHAB POTENTIAL: Good  CLINICAL DECISION MAKING: Evolving/moderate complexity  EVALUATION COMPLEXITY: Moderate   GOALS: Goals reviewed with patient? Yes  SHORT TERM GOALS: Target date: 08/07/2024  Pt will be ind with initial HEP Baseline: Goal status: MET  2.  Pt will demo L knee ROM from 5 to 115 deg Baseline: 07/27/24 = -5 to 120 deg Goal status: PARTIAL MET- see AROM chart  3.  Pt will report reduction in pain by >/=50% Baseline: 4-8/10 variable Goal status: IN PROGRESS  LONG TERM GOALS: Target date: 09/11/2024   Pt will be ind with management and progression of HEP Baseline: 07/27/24 needs assist for correct performance of balance exercises Goal status: IN PROGRESS  2.  Pt will demo L knee ROM from 0 to at least 115 deg for stair negotiation Baseline: 07/27/24 = -5 to 120 Goal status: IN PROGRESS  3.  Pt will be able to ascend/descend steps in a reciprocal fashion to demo increased quad control and strength Baseline: 07/27/24 1 flight with cane, rail, and CGA for reciprocal pattern Goal status: IN PROGRESS  4.  Pt will have improved TUG to </=13 sec without a/d Baseline: 07/27/24 = 14.94 Goal status: IN PROGRESS  5.  Pt will be able to amb >/=1000' independently for community mobility Baseline: 07/27/24- able to go to [facility] and complete 8 minutes of shopping Goal status: IN PROGRESS   6.  Pt will have improved LEFS to >/=24/80 to demo MCID Baseline: 15 07/27/24 = 41/80 Goal status: IN PROGRESS   PLAN:  PT FREQUENCY: 2x/week  PT DURATION: 10 weeks  PLANNED INTERVENTIONS: 97164- PT Re-evaluation, 97750- Physical Performance Testing, 97110-Therapeutic exercises, 97530- Therapeutic activity, 97112- Neuromuscular re-education, 97535- Self Care, 02859- Manual therapy, [phone]- Gait training, [phone]- Aquatic Therapy,  [phone]- Electrical stimulation (unattended), 97016- Vasopneumatic device, [id number]- Ultrasound, [id number]- Ionotophoresis 4mg /ml Dexamethasone , 79439 (1-2 muscles), 20561 (3+ muscles)- Dry Needling, Patient/Family education, Balance training, Stair training, Taping, Joint mobilization, Spinal mobilization, Cryotherapy, and Moist heat  PLAN FOR NEXT SESSION: Continue to work on step ups and quad activation exercises with dynamic balance  Suri Tafolla, PT 07/31/2024, 8:47 PM

## 2024-08-03 ENCOUNTER — Ambulatory Visit: Admitting: Rehabilitation

## 2024-08-03 ENCOUNTER — Encounter: Payer: Self-pay | Admitting: Rehabilitation

## 2024-08-03 DIAGNOSIS — R2689 Other abnormalities of gait and mobility: Secondary | ICD-10-CM

## 2024-08-03 DIAGNOSIS — M6281 Muscle weakness (generalized): Secondary | ICD-10-CM

## 2024-08-03 DIAGNOSIS — R600 Localized edema: Secondary | ICD-10-CM

## 2024-08-03 DIAGNOSIS — M62838 Other muscle spasm: Secondary | ICD-10-CM

## 2024-08-03 DIAGNOSIS — M25662 Stiffness of left knee, not elsewhere classified: Secondary | ICD-10-CM | POA: Diagnosis not present

## 2024-08-03 NOTE — Therapy (Signed)
 OUTPATIENT PHYSICAL THERAPY LOWER EXTREMITY TREATMENT   Patient Name: Whitney Hess MRN: [id number] DOB:1951-10-12, 73 y.o., female Today's Date: 08/03/2024  END OF SESSION:  PT End of Session - 08/03/24 1405     Visit Number 12    Number of Visits 20    Date for PT Re-Evaluation 09/11/24    Authorization Type [employer]    Progress Note Due on Visit 20    PT Start Time 1400    PT Stop Time 1450    PT Time Calculation (min) 50 min    Activity Tolerance Patient tolerated treatment well    Behavior During Therapy WFL for tasks assessed/performed                Past Medical History:  Diagnosis Date   Arthritis    Chronic kidney disease    stage 3   Heart murmur    Hypertension    Stroke [facility])    Past Surgical History:  Procedure Laterality Date   ABDOMINAL HYSTERECTOMY     ANTERIOR CERVICAL DECOMP/DISCECTOMY FUSION     ANTERIOR CRUCIATE LIGAMENT REPAIR Left    ARTHROPLASTY Bilateral    second toe   BACK SURGERY     BRAIN SURGERY     BUNIONECTOMY Bilateral    TOTAL KNEE ARTHROPLASTY Left 06/27/2024   Procedure: ARTHROPLASTY, KNEE, TOTAL;  Surgeon: Ernie Cough, MD;  Location: [facility];  Service: Orthopedics;  Laterality: Left;   Patient Active Problem List   Diagnosis Date Noted   S/P total knee arthroplasty, left 06/27/2024   S/P total knee replacement, left 06/27/2024    PCP: Andrew Truman GRADE., MD  REFERRING PROVIDER: Patti Rosina SAUNDERS, PA-C  REFERRING DIAG: [phone] (ICD-10-CM) - Unilateral primary osteoarthritis, left knee  THERAPY DIAG:  Stiffness of left knee, not elsewhere classified  Other abnormalities of gait and mobility  Muscle weakness (generalized)  Other muscle spasm  Localized edema  Rationale for Evaluation and Treatment: Rehabilitation  ONSET DATE: 06/27/24 L TKA  SUBJECTIVE:   SUBJECTIVE STATEMENT: States weather is bothering her knee today.  Rates pain 4/10.  States is still doing her ice at home for the edema.    Declines ice today  From eval: Pt is now back at home and everything has been okay. I've been getting around real good. I don't always use the walker. Pt states her balance has been good. Has been icing as much as she can. Has been walking for exercise at the house at least every hour. Has been keeping up with her pain medication every 4 hours.   PERTINENT HISTORY: Back fusion (reports chronic foot drop), ACDF   PAIN:  Are you having pain? Yes: NPRS scale: 5/10 Pain location: L knee Pain description: tight Aggravating factors: Standing prolonged time Relieving factors: Ice, pain medication  PRECAUTIONS: None  RED FLAGS: None   WEIGHT BEARING RESTRICTIONS: No  FALLS:  Has patient fallen in last 6 months? No  LIVING ENVIRONMENT: Lives with: lives with their spouse Lives in: House/apartment Stairs: 2 story house with basement but doesn't have to do the steps Has following equipment at home: Vannie - 2 wheeled  OCCUPATION: Retired  PLOF: Independent  PATIENT GOALS: Improve knee strength and mobility  NEXT MD VISIT: 2 week f/u  OBJECTIVE:  Note: Objective measures were completed at Evaluation unless otherwise noted.  DIAGNOSTIC FINDINGS: n/a  PATIENT SURVEYS:  LEFS  Extreme difficulty/unable (0), Quite a bit of difficulty (1), Moderate difficulty (2), Little difficulty (3), No difficulty (  4) Survey date:  07/03/24  Any of your usual work, housework or school activities 1  2. Usual hobbies, recreational or sporting activities 1  3. Getting into/out of the bath 3  4. Walking between rooms 3  5. Putting on socks/shoes 3  6. Squatting  0  7. Lifting an object, like a bag of groceries from the floor 0  8. Performing light activities around your home 1  9. Performing heavy activities around your home 0  10. Getting into/out of a car 2  11. Walking 2 blocks 0  12. Walking 1 mile 0  13. Going up/down 10 stairs (1 flight) 0  14. Standing for 1 hour 0  15.  sitting for 1  hour 1  16. Running on even ground 0  17. Running on uneven ground 0  18. Making sharp turns while running fast 0  19. Hopping  0  20. Rolling over in bed 0  Score total:  15/80     COGNITION: Overall cognitive status: Within functional limits for tasks assessed     SENSATION: WFL  EDEMA:  40 cm on R, 44 cm on L circumferential  MUSCLE LENGTH: Did not assess  POSTURE: No Significant postural limitations and forward head  PALPATION: TTP L quad and anterior thigh  LOWER EXTREMITY ROM:   ROM Right eval Left eval L 07/07/24 L 07/12/24 L 07/20/24 L 07/27/24  Hip flexion        Hip extension        Hip abduction        Hip adduction        Hip internal rotation        Hip external rotation        Knee flexion 115 AROM 100 AROM 108 AAROM  105 - seated 110 supine 120 120  Knee extension -5 LAQ -40 LAQ -12 PROM 20 LAQ -15 LAQ  -5 in supine -10 deg LAQ  Ankle dorsiflexion        Ankle plantarflexion        Ankle inversion        Ankle eversion         (Blank rows = not tested)  LOWER EXTREMITY MMT:  MMT Right eval Left eval LLE 07/27/24  Hip flexion 5 3-   Hip extension     Hip abduction     Hip adduction     Hip internal rotation   3  Hip external rotation   4  Knee flexion 5 4 4+  Knee extension 5 3- 4-  Ankle dorsiflexion   5  Ankle plantarflexion     Ankle inversion     Ankle eversion      (Blank rows = not tested)  LOWER EXTREMITY SPECIAL TESTS:  Did not assess  FUNCTIONAL TESTS:  5 times sit to stand: 11.22 sec with UE use/support;  07/27/24 = 11.10 TUG: 23.65 sec on eval;    07/27/24 = 14.94 sec  GAIT: Distance walked: Into clinic Assistive device utilized: Walker - 2 wheeled Level of assistance: Modified independence Comments: Flexed trunk, slightly antalgic  TREATMENT DATE:  08/03/24 THERAPEUTIC EXERCISE: To improve  ROM.  Demonstration, verbal and tactile cues throughout for technique. Bike full ROM L0 x 8'  seat #7  Standing calf stretch on aerobic step x 1' RLE Standing calf hang stretch on aerobic step x 1' BLE Aerobic step hang toe raises x 20 Aerobic step ups x 10 forward;  x 2/10 lateral LLE Seated knee extension 5# x 3/5 LLE Seated knee flexion 20# x 2/10 LLE LLE SLS golfer lift/cone touch x 20 Reverse lunge/disc slide on RLE x 2/10 LLE Seated knee flexion RTB x 2/10  MANUAL THERAPY: To promote reduced pain utilizing myofascial release. MFR to L hamstrings and posterior L knee fossa in SLR position w/ hamstring stretch  07/31/24 THERAPEUTIC EXERCISE: To improve ROM.  Demonstration, verbal and tactile cues throughout for technique. Bike full ROM L2 x 8 min  THERAPEUTIC ACTIVITIES: To improve functional performance.  Demonstration, verbal and tactile cues throughout for technique. Seated LAQ 4# x 3/10 LLE Standing knee flexion 4# x 3/10 LLE Standing hip flexion 4# x 3/10 LLE Supine SKTC flexion stretch 4# x 30 sec x 3 w/ PT assist Supine SAQ 4# x 3/10 LLE Unilateral bridge x 20 LLE Supine ham stretch x 1' x 2 LLE Seated ham stretch x 1' x 2 LLE RDL to 9 stool 0# x 10 on BLE;  SLS RDL on LLE w/ RUE counter support x 10  NEUROMUSCULAR RE-EDUCATION: To improve balance Tandem stance x 1' EC each leg in front Tandem gait at counter w/ intermittent 1UE support F/B X 3 laps.  VASOPNEUMATIC x 15' L knee at moderate compression  07/27/24 THERAPEUTIC EXERCISE: To improve ROM.  Demonstration, verbal and tactile cues throughout for technique. Bike full ROM L4 x 8 min  THERAPEUTIC ACTIVITIES: To improve functional performance.  Demonstration, verbal and tactile cues throughout for technique. TUG test redone 5x STS redone Stair training down/up 1 flight with rail in LUE, cane in RUE with reciprocal pattern and CGA for foot placement, quad  Contraction, balance maintenance 4# LAQ X 2/10 LLE 4#  HIP ABD X 2/10 LLE 4# hip ext x 2/10 LLE 4# knee flexion x 2/10 LLE QS x 20 w/ 5 sec holds  VASOPNEUMATIC x 15' L knee at moderate compression  07/24/24 THERAPEUTIC EXERCISE: To improve ROM.  Demonstration, verbal and tactile cues throughout for technique. Bike full ROM x 8 min  THERAPEUTIC ACTIVITIES: To improve functional performance.  Demonstration, verbal and tactile cues throughout for technique. 4 step ups F x 10; lateral x 10;  Aerobic step standing calf stretch x 1' x 2 LLE Aerobic step lunges x 10 no UE support Sit n slide flexion stretch x 1' x 2 LLE Sit n prop ext stretch x 2' LLE  NEUROMUSCULAR RE-EDUCATION: To improve balance. Sidestepping at counter GTB x 3 laps Box stepping x 4 CW; x4 CCW Pool noodle step overs (purple) F/B x 10;  S/S x 10 Tandem stance x 1' ea BLE Tandem gait F/B at counter x 3 laps  Vasopneumatic compression to L knee x 15' at moderate compression and 34 deg temp.  07/20/24 Bike x 8 min Seated LAQ 3lb x 20- crepitus in patella Seated LAQ + ball squeeze x 20  Seated HS curls blue TB x 20 SLR L + QS x 20 Supine L HS curl/knee flexion pball x 20 S/L L clamshells 1lb weight x 20 Vaso to L knee x 10 min for edema and pain  07/17/24 Bike  L1 x 8 min Step ups 4' x 20 RLE 1HA Lateral step ups 4' x 20 RLE HA Step downs RLE 2' x 10 Seated LAQ BLE 3lb x 20 Seated HS curl GTB x 20 BLE Checked patellar mobility- WNL all directions SLR x 10 LLE  07/14/24 THERAPEUTIC ACTIVITIES: To improve functional performance.  Step ups 4' LLE x 20 - 1HA Lateral step ups 4' LLE x 20 - HA Standing GTB TKE 10x5  THERAPEUTIC EXERCISE: To improve strength, endurance, ROM, and flexibility.  Bike x 5 min Seated LLE LAQ 2lb 2x10 Seated LLE HS curl GTB 2x10  07/12/24 THERAPEUTIC EXERCISE: To improve strength, endurance, ROM, and flexibility.  Recumbent Bike x 6 min no resistance Supine L SLR + QS 2x10 Supine quad sets Knee ROM measurement THERAPEUTIC  ACTIVITIES: To improve functional performance.  Clock balance L LE 12 to 6 o'clock x 5 Step ups on 2' step x 20 2 HA Lateral step ups on 2' step x 20 2 HA Standing hip abduction 2 x 10 2lb Standing hip extension 2 x 10 2lb Standing marching x 10 2lb  Vaso to L knee x 10 min for edema and pain  07/10/24 NEUROMUSCULAR RE-EDUCATION: To improve kinesthesia and proprioception.  Standing heel/toe raises x 20 Retro step x 20 Knee flexion stretch 6' step x 20; added lateral movement x 20 Mini squat 2x10 - using mirror to even out weight Supine QS 10x5 - pushing into therapist hand  THERAPEUTIC EXERCISE: To improve strength, endurance and ROM.  Rec Bike x 6 min full rev Seated L HS curls RTB x 10 Seated L SAQ x 15 - cues to avoid using hip flexors Seated hip up and over L hip x 10  GAIT TRAINING: To normalize gait pattern and improve safety.  W/o AD- cues for increased R step length and L stance time, cues for heel strike   07/07/24 NEUROMUSCULAR RE-EDUCATION: To improve  kinesthesia and proprioception.  Retro step x 10 with cane Knee flexion lunge stretch x 20 4' Standing heel/toe raises 2 x 10 B Mini squats x 10 SLR + QS x 5 QS pushing into therapist hand- cues to avoid using glutes and curling toes  THERAPEUTIC EXERCISE: To improve strength, endurance, and ROM  Nustep L6x8min UE/LE Seated L SAQ 2x10 with assistance Seated lumbar flexion stretch green pball x1 min  GAIT TRAINING: To normalize gait pattern and improve safety.  With [facility] cues to increase stride length, heel/toe pattern, adjusted AD a notch lower  MANUAL THERAPY: To promote normalized muscle tension and for pain modulation  STM to R lumbar paraspinals 07/05/24 Nustep L6 x 8 min UEs/LEs Seated hamstring stretch x 30 Seated quad set 2x10 Seated SLR with hip abd x10 each knees straight and then bent Seated LAQ AAROM x10 Seated hamstring curl AAROM x10 Standing heel/toe raise x30 Standing hip abd 2x10 red  TB Standing hip ext 2x10 red TB Gait training: 180' no a/d, working on hip extension and equal step length to decrease antalgic gait pattern. Able to perform with slowed gait speed   07/03/24 See HEP below    PATIENT EDUCATION:  Education details: HEP Person educated: Patient Education method: Explanation, Demonstration, and Handouts Education comprehension: verbalized understanding, returned demonstration, and needs further education  HOME EXERCISE PROGRAM: Access Code: [email or web address] URL: [email or web address] Date: 07/03/2024 Prepared by: Gellen April Marie Nonato  Exercises - Supine Heel Slide with Strap  - 2 x daily - 7 x weekly - 1 sets - 10  reps - Supine Quad Set on Towel Roll  - 2-3 x daily - 7 x weekly - 2 sets - 10 reps - Heel Toe Raises with Counter Support  - 1 x daily - 7 x weekly - 2 sets - 10 reps - Standing Hip Abduction with Counter Support  - 1 x daily - 7 x weekly - 2 sets - 10 reps - Standing Hip Extension with Counter Support  - 1 x daily - 7 x weekly - 2 sets - 10 reps - Standing Hamstring Curl with Chair Support  - 1 x daily - 7 x weekly - 2 sets - 10 reps  ASSESSMENT:  CLINICAL IMPRESSION: Focus of treatment today is on L hamstring pain and myofascial pain posterior L thigh/knee.  She tolerates hamstring strengthening well, but needs cueing for correct form and speed. Her HEP is continuing to be progressed weekly.    From eval: Patient is a 73 y.o. F who was seen today for physical therapy evaluation and treatment for L TKA on 06/27/24. Assessment is significant for reduced L knee ROM, strength, and stability affecting transfers and amb for home and community mobility. Pt will benefit from PT to address these deficits to maximize her level of function.   OBJECTIVE IMPAIRMENTS: Abnormal gait, decreased activity tolerance, decreased balance, decreased coordination, decreased endurance, decreased mobility, difficulty walking, decreased ROM, decreased  strength, hypomobility, increased edema, increased fascial restrictions, increased muscle spasms, improper body mechanics, postural dysfunction, and pain.   ACTIVITY LIMITATIONS: carrying, lifting, bending, sitting, standing, squatting, sleeping, stairs, transfers, bed mobility, bathing, toileting, dressing, hygiene/grooming, locomotion level, and caring for others  PARTICIPATION LIMITATIONS: meal prep, cleaning, laundry, driving, shopping, community activity, and yard work  PERSONAL FACTORS: Age, Fitness, Past/current experiences, and Time since onset of injury/illness/exacerbation are also affecting patient's functional outcome.   REHAB POTENTIAL: Good  CLINICAL DECISION MAKING: Evolving/moderate complexity  EVALUATION COMPLEXITY: Moderate   GOALS: Goals reviewed with patient? Yes  SHORT TERM GOALS: Target date: 08/07/2024  Pt will be ind with initial HEP Baseline: Goal status: MET  2.  Pt will demo L knee ROM from 5 to 115 deg Baseline: 07/27/24 = -5 to 120 deg Goal status: PARTIAL MET- see AROM chart  3.  Pt will report reduction in pain by >/=50% Baseline: 4-8/10 variable Goal status: IN PROGRESS  LONG TERM GOALS: Target date: 09/11/2024   Pt will be ind with management and progression of HEP Baseline: 07/27/24 needs assist for correct performance of balance exercises Goal status: IN PROGRESS  2.  Pt will demo L knee ROM from 0 to at least 115 deg for stair negotiation Baseline: 07/27/24 = -5 to 120 Goal status: IN PROGRESS  3.  Pt will be able to ascend/descend steps in a reciprocal fashion to demo increased quad control and strength Baseline: 07/27/24 1 flight with cane, rail, and CGA for reciprocal pattern Goal status: IN PROGRESS  4.  Pt will have improved TUG to </=13 sec without a/d Baseline: 07/27/24 = 14.94 Goal status: IN PROGRESS  5.  Pt will be able to amb >/=1000' independently for community mobility Baseline: 07/27/24- able to go to [facility] and complete 8  minutes of shopping Goal status: IN PROGRESS   6.  Pt will have improved LEFS to >/=24/80 to demo MCID Baseline: 15 07/27/24 = 41/80 Goal status: IN PROGRESS   PLAN:  PT FREQUENCY: 2x/week  PT DURATION: 10 weeks  PLANNED INTERVENTIONS: 97164- PT Re-evaluation, 97750- Physical Performance Testing, 97110-Therapeutic exercises, 97530-  Therapeutic activity, [id number]- Neuromuscular re-education, [phone]- Self Care, 02859- Manual therapy, [id number]- Gait training, [phone]- Aquatic Therapy, [phone]- Electrical stimulation (unattended), 97016- Vasopneumatic device, [id number]- Ultrasound, [id number]- Ionotophoresis 4mg /ml Dexamethasone , 20560 (1-2 muscles), 20561 (3+ muscles)- Dry Needling, Patient/Family education, Balance training, Stair training, Taping, Joint mobilization, Spinal mobilization, Cryotherapy, and Moist heat  PLAN FOR NEXT SESSION: Assess effects of MFR on L posterior knee/thigh pain, ? Hamstring taping; continue hamstring and quad strengthening, balance progression  Anasophia Pecor, PT 08/03/2024, 8:08 PM

## 2024-08-07 ENCOUNTER — Ambulatory Visit: Admitting: Rehabilitation

## 2024-08-07 DIAGNOSIS — M62838 Other muscle spasm: Secondary | ICD-10-CM

## 2024-08-07 DIAGNOSIS — M25662 Stiffness of left knee, not elsewhere classified: Secondary | ICD-10-CM | POA: Diagnosis not present

## 2024-08-07 DIAGNOSIS — R600 Localized edema: Secondary | ICD-10-CM

## 2024-08-07 DIAGNOSIS — R2689 Other abnormalities of gait and mobility: Secondary | ICD-10-CM

## 2024-08-07 DIAGNOSIS — M6281 Muscle weakness (generalized): Secondary | ICD-10-CM

## 2024-08-07 NOTE — Therapy (Addendum)
 OUTPATIENT PHYSICAL THERAPY LOWER EXTREMITY TREATMENT   Patient Name: Whitney Hess MRN: [id number] DOB:[birth year], 73 y.o., female Today's Date: 08/07/2024  END OF SESSION:  PT End of Session - 08/07/24 1408     Visit Number 13    Number of Visits 20    Date for PT Re-Evaluation 09/11/24    Authorization Type [employer]    Progress Note Due on Visit 20    PT Start Time 1406    PT Stop Time 1504    PT Time Calculation (min) 58 min    Activity Tolerance Patient tolerated treatment well    Behavior During Therapy WFL for tasks assessed/performed                Past Medical History:  Diagnosis Date   Arthritis    Chronic kidney disease    stage 3   Heart murmur    Hypertension    Stroke [facility])    Past Surgical History:  Procedure Laterality Date   ABDOMINAL HYSTERECTOMY     ANTERIOR CERVICAL DECOMP/DISCECTOMY FUSION     ANTERIOR CRUCIATE LIGAMENT REPAIR Left    ARTHROPLASTY Bilateral    second toe   BACK SURGERY     BRAIN SURGERY     BUNIONECTOMY Bilateral    TOTAL KNEE ARTHROPLASTY Left 06/27/2024   Procedure: ARTHROPLASTY, KNEE, TOTAL;  Surgeon: Ernie Cough, MD;  Location: [facility];  Service: Orthopedics;  Laterality: Left;   Patient Active Problem List   Diagnosis Date Noted   S/P total knee arthroplasty, left 06/27/2024   S/P total knee replacement, left 06/27/2024    PCP: Andrew Truman GRADE., MD  REFERRING PROVIDER: Patti Rosina SAUNDERS, PA-C  REFERRING DIAG: [phone] (ICD-10-CM) - Unilateral primary osteoarthritis, left knee  THERAPY DIAG:  Stiffness of left knee, not elsewhere classified  Other abnormalities of gait and mobility  Muscle weakness (generalized)  Other muscle spasm  Localized edema  Rationale for Evaluation and Treatment: Rehabilitation  ONSET DATE: 06/27/24 L TKA  SUBJECTIVE:   SUBJECTIVE STATEMENT: 8/11:  States is feeling progressively feeling better.   Rates pain today 1/10 in the L knee.  Denies falls or med  changes.  Still has the posterior L knee/popliteal fossa pain and cyst like area.   From eval: Pt is now back at home and everything has been okay. I've been getting around real good. I don't always use the walker. Pt states her balance has been good. Has been icing as much as she can. Has been walking for exercise at the house at least every hour. Has been keeping up with her pain medication every 4 hours.   PERTINENT HISTORY: Back fusion (reports chronic foot drop), ACDF   PAIN:  Are you having pain? Yes: NPRS scale: 5/10 Pain location: L knee Pain description: tight Aggravating factors: Standing prolonged time Relieving factors: Ice, pain medication  PRECAUTIONS: None  RED FLAGS: None   WEIGHT BEARING RESTRICTIONS: No  FALLS:  Has patient fallen in last 6 months? No  LIVING ENVIRONMENT: Lives with: lives with their spouse Lives in: House/apartment Stairs: 2 story house with basement but doesn't have to do the steps Has following equipment at home: Vannie - 2 wheeled  OCCUPATION: Retired  PLOF: Independent  PATIENT GOALS: Improve knee strength and mobility  NEXT MD VISIT: 2 week f/u  OBJECTIVE:  Note: Objective measures were completed at Evaluation unless otherwise noted.  DIAGNOSTIC FINDINGS: n/a  PATIENT SURVEYS:  LEFS  Extreme difficulty/unable (0), Quite a bit of  difficulty (1), Moderate difficulty (2), Little difficulty (3), No difficulty (4) Survey date:  07/03/24  Any of your usual work, housework or school activities 1  2. Usual hobbies, recreational or sporting activities 1  3. Getting into/out of the bath 3  4. Walking between rooms 3  5. Putting on socks/shoes 3  6. Squatting  0  7. Lifting an object, like a bag of groceries from the floor 0  8. Performing light activities around your home 1  9. Performing heavy activities around your home 0  10. Getting into/out of a car 2  11. Walking 2 blocks 0  12. Walking 1 mile 0  13. Going up/down 10  stairs (1 flight) 0  14. Standing for 1 hour 0  15.  sitting for 1 hour 1  16. Running on even ground 0  17. Running on uneven ground 0  18. Making sharp turns while running fast 0  19. Hopping  0  20. Rolling over in bed 0  Score total:  15/80     COGNITION: Overall cognitive status: Within functional limits for tasks assessed     SENSATION: WFL  EDEMA:  40 cm on R, 44 cm on L circumferential  MUSCLE LENGTH: Did not assess  POSTURE: No Significant postural limitations and forward head  PALPATION: TTP L quad and anterior thigh  LOWER EXTREMITY ROM:   ROM Right eval Left eval L 07/07/24 L 07/12/24 L 07/20/24 L 07/27/24  Hip flexion        Hip extension        Hip abduction        Hip adduction        Hip internal rotation        Hip external rotation        Knee flexion 115 AROM 100 AROM 108 AAROM  105 - seated 110 supine 120 120  Knee extension -5 LAQ -40 LAQ -12 PROM 20 LAQ -15 LAQ  -5 in supine -10 deg LAQ  Ankle dorsiflexion        Ankle plantarflexion        Ankle inversion        Ankle eversion         (Blank rows = not tested)  LOWER EXTREMITY MMT:  MMT Right eval Left eval LLE 07/27/24  Hip flexion 5 3-   Hip extension     Hip abduction     Hip adduction     Hip internal rotation   3  Hip external rotation   4  Knee flexion 5 4 4+  Knee extension 5 3- 4-  Ankle dorsiflexion   5  Ankle plantarflexion     Ankle inversion     Ankle eversion      (Blank rows = not tested)  LOWER EXTREMITY SPECIAL TESTS:  Did not assess  FUNCTIONAL TESTS:  5 times sit to stand: 11.22 sec with UE use/support;  07/27/24 = 11.10 TUG: 23.65 sec on eval;    07/27/24 = 14.94 sec  GAIT: Distance walked: Into clinic Assistive device utilized: Walker - 2 wheeled Level of assistance: Modified independence Comments: Flexed trunk, slightly antalgic  TREATMENT DATE:  08/07/24 THERAPEUTIC EXERCISE: To improve ROM.  Demonstration, verbal and tactile cues throughout for technique. Bike full ROM L0 x 8'  seat 8  Step ups 6 laterally x 2/10 LLE Step hang calf stretch x 1' x 2 BLE Seated ham stretch x 1' x 2 LLE Reverse lunge x 15 BLE SLS on LLE w/ UE support and cone touch on floor x 3/10 Seated heel prop stretch x 1' LLE Seated heel slide flexion stretch x 1' LLE  NEUROMUSCULAR RE-EDUCATION: To improve balance. Foam stand EC x 1';  head turned to R, then L, then up x 20 sec ea Foam heel/toe rocking x 20 Foam SLS w/ toe touch contralateral LE x 1' BLE Tandem stance x 1' ea BLE  08/03/24 THERAPEUTIC EXERCISE: To improve ROM.  Demonstration, verbal and tactile cues throughout for technique. Bike full ROM L0 x 8'  seat #7  Standing calf stretch on aerobic step x 1' RLE Standing calf hang stretch on aerobic step x 1' BLE Aerobic step hang toe raises x 20 Aerobic step ups x 10 forward;  x 2/10 lateral LLE Seated knee extension 5# x 3/5 LLE Seated knee flexion 20# x 2/10 LLE LLE SLS golfer lift/cone touch x 20 Reverse lunge/disc slide on RLE x 2/10 LLE Seated knee flexion RTB x 2/10  MANUAL THERAPY: To promote reduced pain utilizing myofascial release. MFR to L hamstrings and posterior L knee fossa in SLR position w/ hamstring stretch  07/31/24 THERAPEUTIC EXERCISE: To improve ROM.  Demonstration, verbal and tactile cues throughout for technique. Bike full ROM L2 x 8 min  THERAPEUTIC ACTIVITIES: To improve functional performance.  Demonstration, verbal and tactile cues throughout for technique. Seated LAQ 4# x 3/10 LLE Standing knee flexion 4# x 3/10 LLE Standing hip flexion 4# x 3/10 LLE Supine SKTC flexion stretch 4# x 30 sec x 3 w/ PT assist Supine SAQ 4# x 3/10 LLE Unilateral bridge x 20 LLE Supine ham stretch x 1' x 2 LLE Seated ham stretch x 1' x 2 LLE RDL to 9 stool 0# x 10 on BLE;  SLS RDL on LLE w/ RUE counter  support x 10  NEUROMUSCULAR RE-EDUCATION: To improve balance Tandem stance x 1' EC each leg in front Tandem gait at counter w/ intermittent 1UE support F/B X 3 laps.  VASOPNEUMATIC x 15' L knee at moderate compression  07/27/24 THERAPEUTIC EXERCISE: To improve ROM.  Demonstration, verbal and tactile cues throughout for technique. Bike full ROM L4 x 8 min  THERAPEUTIC ACTIVITIES: To improve functional performance.  Demonstration, verbal and tactile cues throughout for technique. TUG test redone 5x STS redone Stair training down/up 1 flight with rail in LUE, cane in RUE with reciprocal pattern and CGA for foot placement, quad  Contraction, balance maintenance 4# LAQ X 2/10 LLE 4# HIP ABD X 2/10 LLE 4# hip ext x 2/10 LLE 4# knee flexion x 2/10 LLE QS x 20 w/ 5 sec holds  VASOPNEUMATIC x 15' L knee at moderate compression PATIENT EDUCATION:  Education details: HEP Person educated: Patient Education method: Explanation, Demonstration, and Handouts Education comprehension: verbalized understanding, returned demonstration, and needs further education  HOME EXERCISE PROGRAM: Access Code: [email or web address] URL: [email or web address] Date: 07/03/2024 Prepared by: Gellen April Marie Nonato  Exercises - Supine Heel Slide with Strap  - 2 x daily - 7 x weekly - 1 sets - 10 reps - Supine Quad Set on Towel Roll  - 2-3 x daily - 7 x weekly -  2 sets - 10 reps - Heel Toe Raises with Counter Support  - 1 x daily - 7 x weekly - 2 sets - 10 reps - Standing Hip Abduction with Counter Support  - 1 x daily - 7 x weekly - 2 sets - 10 reps - Standing Hip Extension with Counter Support  - 1 x daily - 7 x weekly - 2 sets - 10 reps - Standing Hamstring Curl with Chair Support  - 1 x daily - 7 x weekly - 2 sets - 10 reps  ASSESSMENT:  CLINICAL IMPRESSION: 08/07/24:  Patient is progressing well and meeting goals (see goals section for today).  She still has the posterior L popliteal fossa pain and  has some bulging in the area.  She is encouraged to speak with her MD on f/u regarding this.  Balance remains a deficit, but it has improved (see TUG score).   Single leg stance initiated today and needs considerable work.  PT remains necessary for balance deficits, normal gait, reciprocal stairs, decreasing falls risk    From eval: Patient is a 73 y.o. F who was seen today for physical therapy evaluation and treatment for L TKA on 06/27/24. Assessment is significant for reduced L knee ROM, strength, and stability affecting transfers and amb for home and community mobility. Pt will benefit from PT to address these deficits to maximize her level of function.   OBJECTIVE IMPAIRMENTS: Abnormal gait, decreased activity tolerance, decreased balance, decreased coordination, decreased endurance, decreased mobility, difficulty walking, decreased ROM, decreased strength, hypomobility, increased edema, increased fascial restrictions, increased muscle spasms, improper body mechanics, postural dysfunction, and pain.   ACTIVITY LIMITATIONS: carrying, lifting, bending, sitting, standing, squatting, sleeping, stairs, transfers, bed mobility, bathing, toileting, dressing, hygiene/grooming, locomotion level, and caring for others  PARTICIPATION LIMITATIONS: meal prep, cleaning, laundry, driving, shopping, community activity, and yard work  PERSONAL FACTORS: Age, Fitness, Past/current experiences, and Time since onset of injury/illness/exacerbation are also affecting patient's functional outcome.   REHAB POTENTIAL: Good  CLINICAL DECISION MAKING: Evolving/moderate complexity  EVALUATION COMPLEXITY: Moderate   GOALS: Goals reviewed with patient? Yes  SHORT TERM GOALS: Target date: 08/07/2024  Pt will be ind with initial HEP Baseline: Goal status: MET  2.  Pt will demo L knee ROM from 5 to 115 deg Baseline: 07/27/24 = -5 to 120 deg 08/07/24:  -4 to 120 degrees Goal status: MET  3.  Pt will report reduction  in pain by >/=50% Baseline: 4-8/10 variable 08/07/24:  3/10 WORST Goal status: MET  LONG TERM GOALS: Target date: 09/11/2024   Pt will be ind with management and progression of HEP Baseline: 07/27/24 needs assist for correct performance of balance exercises 08/07/24 reviewed and updated Medbridge Go app Goal status: IN PROGRESS  2.  Pt will demo L knee ROM from 0 to at least 115 deg for stair negotiation Baseline: 07/27/24 = -5 to 120 Goal status: IN PROGRESS  3.  Pt will be able to ascend/descend steps in a reciprocal fashion to demo increased quad control and strength Baseline: 07/27/24 1 flight with cane, rail, and CGA for reciprocal pattern Goal status: IN PROGRESS  4.  Pt will have improved TUG to </=13 sec without a/d Baseline: 07/27/24 = 14.94 08/07/24:  11.33 sec Goal status: MET  5.  Pt will be able to amb >/=1000' independently for community mobility Baseline: 07/27/24- able to go to [facility] and complete 8 minutes of shopping Goal status: IN PROGRESS   6.  Pt will have  improved LEFS to >/=24/80 to demo MCID Baseline: 15 07/27/24 = 41/80 Goal status: IN PROGRESS   PLAN:  PT FREQUENCY: 2x/week  PT DURATION: 10 weeks  PLANNED INTERVENTIONS: 97164- PT Re-evaluation, 97750- Physical Performance Testing, 97110-Therapeutic exercises, 97530- Therapeutic activity, 97112- Neuromuscular re-education, 97535- Self Care, [id number]- Manual therapy, [phone]- Gait training, [phone]- Aquatic Therapy, [phone]- Electrical stimulation (unattended), 97016- Vasopneumatic device, [id number]- Ultrasound, [id number]- Ionotophoresis 4mg /ml Dexamethasone , 79439 (1-2 muscles), [id number] (3+ muscles)- Dry Needling, Patient/Family education, Balance training, Stair training, Taping, Joint mobilization, Spinal mobilization, Cryotherapy, and Moist heat  PLAN FOR NEXT SESSION: Progress to higher level balance activities   Lucie Friedlander, PT 08/07/2024, 3:15 PM

## 2024-08-10 ENCOUNTER — Ambulatory Visit: Admitting: Rehabilitation

## 2024-08-10 DIAGNOSIS — R2689 Other abnormalities of gait and mobility: Secondary | ICD-10-CM

## 2024-08-10 DIAGNOSIS — M62838 Other muscle spasm: Secondary | ICD-10-CM

## 2024-08-10 DIAGNOSIS — M25662 Stiffness of left knee, not elsewhere classified: Secondary | ICD-10-CM

## 2024-08-10 DIAGNOSIS — G8929 Other chronic pain: Secondary | ICD-10-CM

## 2024-08-10 DIAGNOSIS — R600 Localized edema: Secondary | ICD-10-CM

## 2024-08-10 DIAGNOSIS — M6281 Muscle weakness (generalized): Secondary | ICD-10-CM

## 2024-08-10 NOTE — Therapy (Signed)
 OUTPATIENT PHYSICAL THERAPY LOWER EXTREMITY TREATMENT   Patient Name: Whitney Hess MRN: [id number] DOB:1951-03-31, 73 y.o., female Today's Date: 08/10/2024  END OF SESSION:  PT End of Session - 08/10/24 1321     Visit Number 14    Number of Visits 20    Date for PT Re-Evaluation 09/11/24    Authorization Type [employer]    Progress Note Due on Visit 20    PT Start Time 1316    PT Stop Time 1405    PT Time Calculation (min) 49 min    Activity Tolerance Patient tolerated treatment well    Behavior During Therapy WFL for tasks assessed/performed                Past Medical History:  Diagnosis Date   Arthritis    Chronic kidney disease    stage 3   Heart murmur    Hypertension    Stroke [facility])    Past Surgical History:  Procedure Laterality Date   ABDOMINAL HYSTERECTOMY     ANTERIOR CERVICAL DECOMP/DISCECTOMY FUSION     ANTERIOR CRUCIATE LIGAMENT REPAIR Left    ARTHROPLASTY Bilateral    second toe   BACK SURGERY     BRAIN SURGERY     BUNIONECTOMY Bilateral    TOTAL KNEE ARTHROPLASTY Left 06/27/2024   Procedure: ARTHROPLASTY, KNEE, TOTAL;  Surgeon: Ernie Cough, MD;  Location: [facility];  Service: Orthopedics;  Laterality: Left;   Patient Active Problem List   Diagnosis Date Noted   S/P total knee arthroplasty, left 06/27/2024   S/P total knee replacement, left 06/27/2024    PCP: Andrew Truman GRADE., MD  REFERRING PROVIDER: Patti Rosina SAUNDERS, PA-C  REFERRING DIAG: [phone] (ICD-10-CM) - Unilateral primary osteoarthritis, left knee  THERAPY DIAG:  Stiffness of left knee, not elsewhere classified  Other abnormalities of gait and mobility  Muscle weakness (generalized)  Other muscle spasm  Localized edema  Chronic pain of left knee  Rationale for Evaluation and Treatment: Rehabilitation  ONSET DATE: 06/27/24 L TKA  SUBJECTIVE:   SUBJECTIVE STATEMENT: 08/10/24:  Patient states she is painfree today.  Still has the mass/cyst in the L popliteal  fossa and is going to speak with her surgeon on next f/u visit about this.  Otherwise she has no c/o   From eval: Pt is now back at home and everything has been okay. I've been getting around real good. I don't always use the walker. Pt states her balance has been good. Has been icing as much as she can. Has been walking for exercise at the house at least every hour. Has been keeping up with her pain medication every 4 hours.   PERTINENT HISTORY: Back fusion (reports chronic foot drop), ACDF   PAIN:  Are you having pain? Yes: NPRS scale: 5/10 Pain location: L knee Pain description: tight Aggravating factors: Standing prolonged time Relieving factors: Ice, pain medication  PRECAUTIONS: None  RED FLAGS: None   WEIGHT BEARING RESTRICTIONS: No  FALLS:  Has patient fallen in last 6 months? No  LIVING ENVIRONMENT: Lives with: lives with their spouse Lives in: House/apartment Stairs: 2 story house with basement but doesn't have to do the steps Has following equipment at home: Vannie - 2 wheeled  OCCUPATION: Retired  PLOF: Independent  PATIENT GOALS: Improve knee strength and mobility  NEXT MD VISIT: 2 week f/u  OBJECTIVE:  Note: Objective measures were completed at Evaluation unless otherwise noted.  DIAGNOSTIC FINDINGS: n/a  PATIENT SURVEYS:  LEFS  Extreme difficulty/unable (0), Quite a bit of difficulty (1), Moderate difficulty (2), Little difficulty (3), No difficulty (4) Survey date:  07/03/24  Any of your usual work, housework or school activities 1  2. Usual hobbies, recreational or sporting activities 1  3. Getting into/out of the bath 3  4. Walking between rooms 3  5. Putting on socks/shoes 3  6. Squatting  0  7. Lifting an object, like a bag of groceries from the floor 0  8. Performing light activities around your home 1  9. Performing heavy activities around your home 0  10. Getting into/out of a car 2  11. Walking 2 blocks 0  12. Walking 1 mile 0  13.  Going up/down 10 stairs (1 flight) 0  14. Standing for 1 hour 0  15.  sitting for 1 hour 1  16. Running on even ground 0  17. Running on uneven ground 0  18. Making sharp turns while running fast 0  19. Hopping  0  20. Rolling over in bed 0  Score total:  15/80     COGNITION: Overall cognitive status: Within functional limits for tasks assessed     SENSATION: WFL  EDEMA:  40 cm on R, 44 cm on L circumferential  MUSCLE LENGTH: Did not assess  POSTURE: No Significant postural limitations and forward head  PALPATION: TTP L quad and anterior thigh  LOWER EXTREMITY ROM:   ROM Right eval Left eval L 07/07/24 L 07/12/24 L 07/20/24 L 07/27/24  Hip flexion        Hip extension        Hip abduction        Hip adduction        Hip internal rotation        Hip external rotation        Knee flexion 115 AROM 100 AROM 108 AAROM  105 - seated 110 supine 120 120  Knee extension -5 LAQ -40 LAQ -12 PROM 20 LAQ -15 LAQ  -5 in supine -10 deg LAQ  Ankle dorsiflexion        Ankle plantarflexion        Ankle inversion        Ankle eversion         (Blank rows = not tested)  LOWER EXTREMITY MMT:  MMT Right eval Left eval LLE 07/27/24  Hip flexion 5 3-   Hip extension     Hip abduction     Hip adduction     Hip internal rotation   3  Hip external rotation   4  Knee flexion 5 4 4+  Knee extension 5 3- 4-  Ankle dorsiflexion   5  Ankle plantarflexion     Ankle inversion     Ankle eversion      (Blank rows = not tested)  LOWER EXTREMITY SPECIAL TESTS:  Did not assess  FUNCTIONAL TESTS:  5 times sit to stand: 11.22 sec with UE use/support;  07/27/24 = 11.10 TUG: 23.65 sec on eval;    07/27/24 = 14.94 sec  GAIT: Distance walked: Into clinic Assistive device utilized: Walker - 2 wheeled Level of assistance: Modified independence Comments: Flexed trunk, slightly antalgic  TREATMENT DATE:  08/10/24 THERAPEUTIC EXERCISE: To improve ROM.  Demonstration, verbal and tactile cues throughout for technique. Bike full ROM L4 x 8'  seat 8  THERAPEUTIC ACTIVITIES: To improve functional performance.  Demonstration, verbal and tactile cues throughout for technique. Step ups 6 laterally x 2/10 LLE Step ups 6 forward x 2/10 LLE Step hang calf stretch x 1' x 2 BLE  NEUROMUSCULAR RE-EDUCATION: To improve balance. Long foam standing balance x 1' EO, x 1' EC Long foam minisquat x 2/10  Long foam lateral lunge x 2/10 LLE Long foam sidestepping Corner tandem balance x 1' BLE Counter tandem gait F/B X 4 LAPS  08/07/24 THERAPEUTIC EXERCISE: To improve ROM.  Demonstration, verbal and tactile cues throughout for technique. Bike full ROM L0 x 8'  seat 8  Step ups 6 laterally x 2/10 LLE Step hang calf stretch x 1' x 2 BLE Seated ham stretch x 1' x 2 LLE Reverse lunge x 15 BLE SLS on LLE w/ UE support and cone touch on floor x 3/10 Seated heel prop stretch x 1' LLE Seated heel slide flexion stretch x 1' LLE  NEUROMUSCULAR RE-EDUCATION: To improve balance. Foam stand EC x 1';  head turned to R, then L, then up x 20 sec ea Foam heel/toe rocking x 20 Foam SLS w/ toe touch contralateral LE x 1' BLE Tandem stance x 1' ea BLE  PATIENT EDUCATION:  Education details: reviewed forward and lateral step ups to do at home; [place] flexion stretch Person educated: Patient Education method: Explanation, Demonstration, and Handouts Education comprehension: verbalized understanding, returned demonstration, and needs further education  HOME EXERCISE PROGRAM: Access Code: [email or web address] URL: [email or web address] Date: 07/03/2024 Prepared by: Gellen April Earnie Starring  Exercises - Supine Heel Slide with Strap  - 2 x daily - 7 x weekly - 1 sets - 10 reps - Supine Quad Set on Towel Roll  - 2-3 x daily - 7 x weekly - 2 sets - 10 reps - Heel Toe Raises  with Counter Support  - 1 x daily - 7 x weekly - 2 sets - 10 reps - Standing Hip Abduction with Counter Support  - 1 x daily - 7 x weekly - 2 sets - 10 reps - Standing Hip Extension with Counter Support  - 1 x daily - 7 x weekly - 2 sets - 10 reps - Standing Hamstring Curl with Chair Support  - 1 x daily - 7 x weekly - 2 sets - 10 reps  ASSESSMENT:  CLINICAL IMPRESSION: Patient is progressing very well with PT.  Pain level is much improved today.   She is able to move forward to higher level blaance activities with good tolerance.   She needs continued focus on balance which is still lacking dynamically.    From eval: Patient is a 73 y.o. F who was seen today for physical therapy evaluation and treatment for L TKA on 06/27/24. Assessment is significant for reduced L knee ROM, strength, and stability affecting transfers and amb for home and community mobility. Pt will benefit from PT to address these deficits to maximize her level of function.   OBJECTIVE IMPAIRMENTS: Abnormal gait, decreased activity tolerance, decreased balance, decreased coordination, decreased endurance, decreased mobility, difficulty walking, decreased ROM, decreased strength, hypomobility, increased edema, increased fascial restrictions, increased muscle spasms, improper body mechanics, postural dysfunction, and pain.   ACTIVITY LIMITATIONS: carrying, lifting, bending, sitting, standing, squatting, sleeping, stairs, transfers, bed mobility, bathing, toileting, dressing, hygiene/grooming, locomotion level, and caring for  others  PARTICIPATION LIMITATIONS: meal prep, cleaning, laundry, driving, shopping, community activity, and yard work  PERSONAL FACTORS: Age, Fitness, Past/current experiences, and Time since onset of injury/illness/exacerbation are also affecting patient's functional outcome.   REHAB POTENTIAL: Good  CLINICAL DECISION MAKING: Evolving/moderate complexity  EVALUATION COMPLEXITY: Moderate   GOALS: Goals  reviewed with patient? Yes  SHORT TERM GOALS: Target date: 08/07/2024  Pt will be ind with initial HEP Baseline: Goal status: MET  2.  Pt will demo L knee ROM from 5 to 115 deg Baseline: 07/27/24 = -5 to 120 deg 08/07/24:  -4 to 120 degrees Goal status: MET  3.  Pt will report reduction in pain by >/=50% Baseline: 4-8/10 variable 08/07/24:  3/10 WORST Goal status: MET  LONG TERM GOALS: Target date: 09/11/2024   Pt will be ind with management and progression of HEP Baseline: 07/27/24 needs assist for correct performance of balance exercises 08/07/24 reviewed and updated Medbridge Go app Goal status: IN PROGRESS  2.  Pt will demo L knee ROM from 0 to at least 115 deg for stair negotiation Baseline: 07/27/24 = -5 to 120 08/10/24:  -3 to 125 degrees Goal status: MET  3.  Pt will be able to ascend/descend steps in a reciprocal fashion to demo increased quad control and strength Baseline: 07/27/24 1 flight with cane, rail, and CGA for reciprocal pattern Goal status: IN PROGRESS  4.  Pt will have improved TUG to </=13 sec without a/d Baseline: 07/27/24 = 14.94 08/07/24:  11.33 sec Goal status: MET  5.  Pt will be able to amb >/=1000' independently for community mobility Baseline: 07/27/24- able to go to [facility] and complete 8 minutes of shopping Goal status: IN PROGRESS   6.  Pt will have improved LEFS to >/=24/80 to demo MCID Baseline: 15 07/27/24 = 41/80 Goal status: IN PROGRESS   PLAN:  PT FREQUENCY: 2x/week  PT DURATION: 10 weeks  PLANNED INTERVENTIONS: 97164- PT Re-evaluation, 97750- Physical Performance Testing, 97110-Therapeutic exercises, 97530- Therapeutic activity, 97112- Neuromuscular re-education, 97535- Self Care, 02859- Manual therapy, [phone]- Gait training, [phone]- Aquatic Therapy, [phone]- Electrical stimulation (unattended), 97016- Vasopneumatic device, [id number]- Ultrasound, [id number]- Ionotophoresis 4mg /ml Dexamethasone , 79439 (1-2 muscles), 20561 (3+ muscles)- Dry Needling,  Patient/Family education, Balance training, Stair training, Taping, Joint mobilization, Spinal mobilization, Cryotherapy, and Moist heat  PLAN FOR NEXT SESSION: continue to work on balance, L knee strength and ROM   Tatem Holsonback, PT 08/10/2024, 2:26 PM

## 2024-08-14 ENCOUNTER — Ambulatory Visit

## 2024-08-14 ENCOUNTER — Encounter: Payer: Self-pay | Admitting: Rehabilitation

## 2024-08-14 DIAGNOSIS — M6281 Muscle weakness (generalized): Secondary | ICD-10-CM

## 2024-08-14 DIAGNOSIS — M25662 Stiffness of left knee, not elsewhere classified: Secondary | ICD-10-CM | POA: Diagnosis not present

## 2024-08-14 DIAGNOSIS — R600 Localized edema: Secondary | ICD-10-CM

## 2024-08-14 DIAGNOSIS — R2689 Other abnormalities of gait and mobility: Secondary | ICD-10-CM

## 2024-08-14 DIAGNOSIS — M62838 Other muscle spasm: Secondary | ICD-10-CM

## 2024-08-14 NOTE — Therapy (Signed)
 OUTPATIENT PHYSICAL THERAPY LOWER EXTREMITY TREATMENT   Patient Name: Whitney Hess MRN: [id number] DOB:1951-07-15, 73 y.o., female Today's Date: 08/14/2024  END OF SESSION:  PT End of Session - 08/14/24 1446     Visit Number 15    Number of Visits 20    Date for PT Re-Evaluation 09/11/24    Authorization Type [employer] Medicare    Progress Note Due on Visit 20    PT Start Time 1400    PT Stop Time 1446    PT Time Calculation (min) 46 min    Activity Tolerance Patient tolerated treatment well    Behavior During Therapy WFL for tasks assessed/performed                 Past Medical History:  Diagnosis Date   Arthritis    Chronic kidney disease    stage 3   Heart murmur    Hypertension    Stroke [facility])    Past Surgical History:  Procedure Laterality Date   ABDOMINAL HYSTERECTOMY     ANTERIOR CERVICAL DECOMP/DISCECTOMY FUSION     ANTERIOR CRUCIATE LIGAMENT REPAIR Left    ARTHROPLASTY Bilateral    second toe   BACK SURGERY     BRAIN SURGERY     BUNIONECTOMY Bilateral    TOTAL KNEE ARTHROPLASTY Left 06/27/2024   Procedure: ARTHROPLASTY, KNEE, TOTAL;  Surgeon: Ernie Cough, MD;  Location: [facility];  Service: Orthopedics;  Laterality: Left;   Patient Active Problem List   Diagnosis Date Noted   S/P total knee arthroplasty, left 06/27/2024   S/P total knee replacement, left 06/27/2024    PCP: Andrew Truman GRADE., MD  REFERRING PROVIDER: Patti Rosina SAUNDERS, PA-C  REFERRING DIAG: [phone] (ICD-10-CM) - Unilateral primary osteoarthritis, left knee  THERAPY DIAG:  Stiffness of left knee, not elsewhere classified  Other abnormalities of gait and mobility  Muscle weakness (generalized)  Other muscle spasm  Localized edema  Rationale for Evaluation and Treatment: Rehabilitation  ONSET DATE: 06/27/24 L TKA  SUBJECTIVE:   SUBJECTIVE STATEMENT: Slept all night last night, it's really tight when first get to moving but loosens up over time  From eval: Pt is now  back at home and everything has been okay. I've been getting around real good. I don't always use the walker. Pt states her balance has been good. Has been icing as much as she can. Has been walking for exercise at the house at least every hour. Has been keeping up with her pain medication every 4 hours.   PERTINENT HISTORY: Back fusion (reports chronic foot drop), ACDF   PAIN:  Are you having pain? Yes: NPRS scale: 0/10 Pain location: L knee Pain description: tight Aggravating factors: Standing prolonged time Relieving factors: Ice, pain medication  PRECAUTIONS: None  RED FLAGS: None   WEIGHT BEARING RESTRICTIONS: No  FALLS:  Has patient fallen in last 6 months? No  LIVING ENVIRONMENT: Lives with: lives with their spouse Lives in: House/apartment Stairs: 2 story house with basement but doesn't have to do the steps Has following equipment at home: Vannie - 2 wheeled  OCCUPATION: Retired  PLOF: Independent  PATIENT GOALS: Improve knee strength and mobility  NEXT MD VISIT: 2 week f/u  OBJECTIVE:  Note: Objective measures were completed at Evaluation unless otherwise noted.  DIAGNOSTIC FINDINGS: n/a  PATIENT SURVEYS:  LEFS  Extreme difficulty/unable (0), Quite a bit of difficulty (1), Moderate difficulty (2), Little difficulty (3), No difficulty (4) Survey date:  07/03/24  Any of your  usual work, housework or school activities 1  2. Usual hobbies, recreational or sporting activities 1  3. Getting into/out of the bath 3  4. Walking between rooms 3  5. Putting on socks/shoes 3  6. Squatting  0  7. Lifting an object, like a bag of groceries from the floor 0  8. Performing light activities around your home 1  9. Performing heavy activities around your home 0  10. Getting into/out of a car 2  11. Walking 2 blocks 0  12. Walking 1 mile 0  13. Going up/down 10 stairs (1 flight) 0  14. Standing for 1 hour 0  15.  sitting for 1 hour 1  16. Running on even ground 0   17. Running on uneven ground 0  18. Making sharp turns while running fast 0  19. Hopping  0  20. Rolling over in bed 0  Score total:  15/80     COGNITION: Overall cognitive status: Within functional limits for tasks assessed     SENSATION: WFL  EDEMA:  40 cm on R, 44 cm on L circumferential  MUSCLE LENGTH: Did not assess  POSTURE: No Significant postural limitations and forward head  PALPATION: TTP L quad and anterior thigh  LOWER EXTREMITY ROM:   ROM Right eval Left eval L 07/07/24 L 07/12/24 L 07/20/24 L 07/27/24  Hip flexion        Hip extension        Hip abduction        Hip adduction        Hip internal rotation        Hip external rotation        Knee flexion 115 AROM 100 AROM 108 AAROM  105 - seated 110 supine 120 120  Knee extension -5 LAQ -40 LAQ -12 PROM 20 LAQ -15 LAQ  -5 in supine -10 deg LAQ  Ankle dorsiflexion        Ankle plantarflexion        Ankle inversion        Ankle eversion         (Blank rows = not tested)  LOWER EXTREMITY MMT:  MMT Right eval Left eval LLE 07/27/24  Hip flexion 5 3-   Hip extension     Hip abduction     Hip adduction     Hip internal rotation   3  Hip external rotation   4  Knee flexion 5 4 4+  Knee extension 5 3- 4-  Ankle dorsiflexion   5  Ankle plantarflexion     Ankle inversion     Ankle eversion      (Blank rows = not tested)  LOWER EXTREMITY SPECIAL TESTS:  Did not assess  FUNCTIONAL TESTS:  5 times sit to stand: 11.22 sec with UE use/support;  07/27/24 = 11.10 TUG: 23.65 sec on eval;    07/27/24 = 14.94 sec  GAIT: Distance walked: Into clinic Assistive device utilized: Walker - 2 wheeled Level of assistance: Modified independence Comments: Flexed trunk, slightly antalgic  TREATMENT DATE:  08/14/24 THERAPEUTIC EXERCISE: To improve ROM.  Demonstration, verbal and  tactile cues throughout for technique. Bike full ROM L4 x 8'  seat 8  THERAPEUTIC ACTIVITIES: To improve functional performance.  Demonstration, verbal and tactile cues throughout for technique. Leg curls 25lb 2x12 BLE Leg ext 15lb 2x12 BLE Step ups 6 laterally x 15 LLE Step ups 6 forward x 15 LLE  NEUROMUSCULAR RE-EDUCATION: To improve balance. Airex pad squat 2x10 lateral sliding lunge x /10 LLE Fwd sliding lunge x /10 LLE Clock reaching RLE from airex to floor 3 way x 8  08/10/24 THERAPEUTIC EXERCISE: To improve ROM.  Demonstration, verbal and tactile cues throughout for technique. Bike full ROM L4 x 8'  seat 8  THERAPEUTIC ACTIVITIES: To improve functional performance.  Demonstration, verbal and tactile cues throughout for technique. Step ups 6 laterally x 2/10 LLE Step ups 6 forward x 2/10 LLE Step hang calf stretch x 1' x 2 BLE  NEUROMUSCULAR RE-EDUCATION: To improve balance. Long foam standing balance x 1' EO, x 1' EC Long foam minisquat x 2/10  Long foam lateral lunge x 2/10 LLE Long foam sidestepping Corner tandem balance x 1' BLE Counter tandem gait F/B X 4 LAPS  08/07/24 THERAPEUTIC EXERCISE: To improve ROM.  Demonstration, verbal and tactile cues throughout for technique. Bike full ROM L0 x 8'  seat 8  Step ups 6 laterally x 2/10 LLE Step hang calf stretch x 1' x 2 BLE Seated ham stretch x 1' x 2 LLE Reverse lunge x 15 BLE SLS on LLE w/ UE support and cone touch on floor x 3/10 Seated heel prop stretch x 1' LLE Seated heel slide flexion stretch x 1' LLE  NEUROMUSCULAR RE-EDUCATION: To improve balance. Foam stand EC x 1';  head turned to R, then L, then up x 20 sec ea Foam heel/toe rocking x 20 Foam SLS w/ toe touch contralateral LE x 1' BLE Tandem stance x 1' ea BLE  PATIENT EDUCATION:  Education details: reviewed forward and lateral step ups to do at home; [facility] flexion stretch Person educated: Patient Education method: Explanation, Demonstration,  and Handouts Education comprehension: verbalized understanding, returned demonstration, and needs further education  HOME EXERCISE PROGRAM: Access Code: [email or web address] URL: [email or web address] Date: 07/03/2024 Prepared by: Gellen April Earnie Starring  Exercises - Supine Heel Slide with Strap  - 2 x daily - 7 x weekly - 1 sets - 10 reps - Supine Quad Set on Towel Roll  - 2-3 x daily - 7 x weekly - 2 sets - 10 reps - Heel Toe Raises with Counter Support  - 1 x daily - 7 x weekly - 2 sets - 10 reps - Standing Hip Abduction with Counter Support  - 1 x daily - 7 x weekly - 2 sets - 10 reps - Standing Hip Extension with Counter Support  - 1 x daily - 7 x weekly - 2 sets - 10 reps - Standing Hamstring Curl with Chair Support  - 1 x daily - 7 x weekly - 2 sets - 10 reps  ASSESSMENT:  CLINICAL IMPRESSION: Pt able to complete all interventions today. Pt showing difficulty with single leg balance activities. Cues needed for posture throughout session. She needs continued focus on balance which is still lacking dynamically.    From eval: Patient is a 73 y.o. F who was seen today for physical therapy evaluation and treatment for L TKA on 06/27/24. Assessment is significant for reduced L knee ROM, strength, and  stability affecting transfers and amb for home and community mobility. Pt will benefit from PT to address these deficits to maximize her level of function.   OBJECTIVE IMPAIRMENTS: Abnormal gait, decreased activity tolerance, decreased balance, decreased coordination, decreased endurance, decreased mobility, difficulty walking, decreased ROM, decreased strength, hypomobility, increased edema, increased fascial restrictions, increased muscle spasms, improper body mechanics, postural dysfunction, and pain.   ACTIVITY LIMITATIONS: carrying, lifting, bending, sitting, standing, squatting, sleeping, stairs, transfers, bed mobility, bathing, toileting, dressing, hygiene/grooming, locomotion level,  and caring for others  PARTICIPATION LIMITATIONS: meal prep, cleaning, laundry, driving, shopping, community activity, and yard work  PERSONAL FACTORS: Age, Fitness, Past/current experiences, and Time since onset of injury/illness/exacerbation are also affecting patient's functional outcome.   REHAB POTENTIAL: Good  CLINICAL DECISION MAKING: Evolving/moderate complexity  EVALUATION COMPLEXITY: Moderate   GOALS: Goals reviewed with patient? Yes  SHORT TERM GOALS: Target date: 08/07/2024  Pt will be ind with initial HEP Baseline: Goal status: MET  2.  Pt will demo L knee ROM from 5 to 115 deg Baseline: 07/27/24 = -5 to 120 deg 08/07/24:  -4 to 120 degrees Goal status: MET  3.  Pt will report reduction in pain by >/=50% Baseline: 4-8/10 variable 08/07/24:  3/10 WORST Goal status: MET  LONG TERM GOALS: Target date: 09/11/2024   Pt will be ind with management and progression of HEP Baseline: 07/27/24 needs assist for correct performance of balance exercises 08/07/24 reviewed and updated Medbridge Go app Goal status: IN PROGRESS  2.  Pt will demo L knee ROM from 0 to at least 115 deg for stair negotiation Baseline: 07/27/24 = -5 to 120 08/10/24:  -3 to 125 degrees Goal status: MET  3.  Pt will be able to ascend/descend steps in a reciprocal fashion to demo increased quad control and strength Baseline: 07/27/24 1 flight with cane, rail, and CGA for reciprocal pattern Goal status: IN PROGRESS  4.  Pt will have improved TUG to </=13 sec without a/d Baseline: 07/27/24 = 14.94 08/07/24:  11.33 sec Goal status: MET  5.  Pt will be able to amb >/=1000' independently for community mobility Baseline: 07/27/24- able to go to [facility] and complete 8 minutes of shopping Goal status: IN PROGRESS   6.  Pt will have improved LEFS to >/=24/80 to demo MCID Baseline: 15 07/27/24 = 41/80 Goal status: IN PROGRESS   PLAN:  PT FREQUENCY: 2x/week  PT DURATION: 10 weeks  PLANNED  INTERVENTIONS: 97164- PT Re-evaluation, 97750- Physical Performance Testing, 97110-Therapeutic exercises, 97530- Therapeutic activity, 97112- Neuromuscular re-education, 97535- Self Care, 02859- Manual therapy, [phone]- Gait training, [phone]- Aquatic Therapy, [phone]- Electrical stimulation (unattended), 97016- Vasopneumatic device, [id number]- Ultrasound, [id number]- Ionotophoresis 4mg /ml Dexamethasone , 79439 (1-2 muscles), 20561 (3+ muscles)- Dry Needling, Patient/Family education, Balance training, Stair training, Taping, Joint mobilization, Spinal mobilization, Cryotherapy, and Moist heat  PLAN FOR NEXT SESSION: continue to work on balance, L knee strength and ROM   Aurea Aronov L Khrystyne Arpin, PTA 08/14/2024, 2:47 PM

## 2024-08-21 ENCOUNTER — Ambulatory Visit

## 2024-08-21 DIAGNOSIS — M6281 Muscle weakness (generalized): Secondary | ICD-10-CM

## 2024-08-21 DIAGNOSIS — M62838 Other muscle spasm: Secondary | ICD-10-CM

## 2024-08-21 DIAGNOSIS — R2689 Other abnormalities of gait and mobility: Secondary | ICD-10-CM

## 2024-08-21 DIAGNOSIS — R600 Localized edema: Secondary | ICD-10-CM

## 2024-08-21 DIAGNOSIS — M25662 Stiffness of left knee, not elsewhere classified: Secondary | ICD-10-CM

## 2024-08-21 NOTE — Therapy (Signed)
 OUTPATIENT PHYSICAL THERAPY LOWER EXTREMITY TREATMENT   Patient Name: Jacia Sickman MRN: [id number] DOB:Jan 05, 1951, 73 y.o., female Today's Date: 08/21/2024  END OF SESSION:  PT End of Session - 08/21/24 1229     Visit Number 16    Number of Visits 20    Date for PT Re-Evaluation 09/11/24    Authorization Type [employer]    Progress Note Due on Visit 20    PT Start Time 1147    PT Stop Time 1230    PT Time Calculation (min) 43 min    Activity Tolerance Patient tolerated treatment well    Behavior During Therapy WFL for tasks assessed/performed                  Past Medical History:  Diagnosis Date   Arthritis    Chronic kidney disease    stage 3   Heart murmur    Hypertension    Stroke [facility])    Past Surgical History:  Procedure Laterality Date   ABDOMINAL HYSTERECTOMY     ANTERIOR CERVICAL DECOMP/DISCECTOMY FUSION     ANTERIOR CRUCIATE LIGAMENT REPAIR Left    ARTHROPLASTY Bilateral    second toe   BACK SURGERY     BRAIN SURGERY     BUNIONECTOMY Bilateral    TOTAL KNEE ARTHROPLASTY Left 06/27/2024   Procedure: ARTHROPLASTY, KNEE, TOTAL;  Surgeon: Ernie Cough, MD;  Location: [facility];  Service: Orthopedics;  Laterality: Left;   Patient Active Problem List   Diagnosis Date Noted   S/P total knee arthroplasty, left 06/27/2024   S/P total knee replacement, left 06/27/2024    PCP: Andrew Truman GRADE., MD  REFERRING PROVIDER: Patti Rosina SAUNDERS, PA-C  REFERRING DIAG: [phone] (ICD-10-CM) - Unilateral primary osteoarthritis, left knee  THERAPY DIAG:  Stiffness of left knee, not elsewhere classified  Other abnormalities of gait and mobility  Muscle weakness (generalized)  Other muscle spasm  Localized edema  Rationale for Evaluation and Treatment: Rehabilitation  ONSET DATE: 06/27/24 L TKA  SUBJECTIVE:   SUBJECTIVE STATEMENT: Pt reports she used an ab roller machine this morning and it made her sore. She is sleeping about 6 hours a  night  From eval: Pt is now back at home and everything has been okay. I've been getting around real good. I don't always use the walker. Pt states her balance has been good. Has been icing as much as she can. Has been walking for exercise at the house at least every hour. Has been keeping up with her pain medication every 4 hours.   PERTINENT HISTORY: Back fusion (reports chronic foot drop), ACDF   PAIN:  Are you having pain? Yes: NPRS scale: 0/10 Pain location: L knee Pain description: tight Aggravating factors: Standing prolonged time Relieving factors: Ice, pain medication  PRECAUTIONS: None  RED FLAGS: None   WEIGHT BEARING RESTRICTIONS: No  FALLS:  Has patient fallen in last 6 months? No  LIVING ENVIRONMENT: Lives with: lives with their spouse Lives in: House/apartment Stairs: 2 story house with basement but doesn't have to do the steps Has following equipment at home: Vannie - 2 wheeled  OCCUPATION: Retired  PLOF: Independent  PATIENT GOALS: Improve knee strength and mobility  NEXT MD VISIT: 08/21/24  OBJECTIVE:  Note: Objective measures were completed at Evaluation unless otherwise noted.  DIAGNOSTIC FINDINGS: n/a  PATIENT SURVEYS:  LEFS  Extreme difficulty/unable (0), Quite a bit of difficulty (1), Moderate difficulty (2), Little difficulty (3), No difficulty (4) Survey date:  07/03/24  Any of your usual work, housework or school activities 1  2. Usual hobbies, recreational or sporting activities 1  3. Getting into/out of the bath 3  4. Walking between rooms 3  5. Putting on socks/shoes 3  6. Squatting  0  7. Lifting an object, like a bag of groceries from the floor 0  8. Performing light activities around your home 1  9. Performing heavy activities around your home 0  10. Getting into/out of a car 2  11. Walking 2 blocks 0  12. Walking 1 mile 0  13. Going up/down 10 stairs (1 flight) 0  14. Standing for 1 hour 0  15.  sitting for 1 hour 1  16.  Running on even ground 0  17. Running on uneven ground 0  18. Making sharp turns while running fast 0  19. Hopping  0  20. Rolling over in bed 0  Score total:  15/80     COGNITION: Overall cognitive status: Within functional limits for tasks assessed     SENSATION: WFL  EDEMA:  40 cm on R, 44 cm on L circumferential  MUSCLE LENGTH: Did not assess  POSTURE: No Significant postural limitations and forward head  PALPATION: TTP L quad and anterior thigh  LOWER EXTREMITY ROM:   ROM Right eval Left eval L 07/07/24 L 07/12/24 L 07/20/24 L 07/27/24 08/21/24 L  Hip flexion         Hip extension         Hip abduction         Hip adduction         Hip internal rotation         Hip external rotation         Knee flexion 115 AROM 100 AROM 108 AAROM  105 - seated 110 supine 120 120 123  Knee extension -5 LAQ -40 LAQ -12 PROM 20 LAQ -15 LAQ  -5 in supine -10 deg LAQ 0- supine  Ankle dorsiflexion         Ankle plantarflexion         Ankle inversion         Ankle eversion          (Blank rows = not tested)  LOWER EXTREMITY MMT:  MMT Right eval Left eval LLE 07/27/24  Hip flexion 5 3-   Hip extension     Hip abduction     Hip adduction     Hip internal rotation   3  Hip external rotation   4  Knee flexion 5 4 4+  Knee extension 5 3- 4-  Ankle dorsiflexion   5  Ankle plantarflexion     Ankle inversion     Ankle eversion      (Blank rows = not tested)  LOWER EXTREMITY SPECIAL TESTS:  Did not assess  FUNCTIONAL TESTS:  5 times sit to stand: 11.22 sec with UE use/support;  07/27/24 = 11.10 TUG: 23.65 sec on eval;    07/27/24 = 14.94 sec  GAIT: Distance walked: Into clinic Assistive device utilized: Walker - 2 wheeled Level of assistance: Modified independence Comments: Flexed trunk, slightly antalgic  TREATMENT DATE:  08/21/24 THERAPEUTIC  EXERCISE: To improve ROM.  Demonstration, verbal and tactile cues throughout for technique. Bike full ROM L4 x 8'  seat 8  THERAPEUTIC ACTIVITIES: To improve functional performance.  Demonstration, verbal and tactile cues throughout for technique. Stairs; ascending/descending 14 stairs 2x  Leg press 20lb x 20 BLE RLE 5lb x 20 RLE Leg curls 25lb x 20 BLE Leg ext 15lb x 20 BLE LLE 5lb x 20 leg ext   08/14/24 THERAPEUTIC EXERCISE: To improve ROM.  Demonstration, verbal and tactile cues throughout for technique. Bike full ROM L4 x 8'  seat 8  THERAPEUTIC ACTIVITIES: To improve functional performance.  Demonstration, verbal and tactile cues throughout for technique. Leg curls 25lb 2x12 BLE Leg ext 15lb 2x12 BLE Step ups 6 laterally x 15 LLE Step ups 6 forward x 15 LLE  NEUROMUSCULAR RE-EDUCATION: To improve balance. Airex pad squat 2x10 lateral sliding lunge x /10 LLE Fwd sliding lunge x /10 LLE Clock reaching RLE from airex to floor 3 way x 8  08/10/24 THERAPEUTIC EXERCISE: To improve ROM.  Demonstration, verbal and tactile cues throughout for technique. Bike full ROM L4 x 8'  seat 8  THERAPEUTIC ACTIVITIES: To improve functional performance.  Demonstration, verbal and tactile cues throughout for technique. Step ups 6 laterally x 2/10 LLE Step ups 6 forward x 2/10 LLE Step hang calf stretch x 1' x 2 BLE  NEUROMUSCULAR RE-EDUCATION: To improve balance. Long foam standing balance x 1' EO, x 1' EC Long foam minisquat x 2/10  Long foam lateral lunge x 2/10 LLE Long foam sidestepping Corner tandem balance x 1' BLE Counter tandem gait F/B X 4 LAPS  08/07/24 THERAPEUTIC EXERCISE: To improve ROM.  Demonstration, verbal and tactile cues throughout for technique. Bike full ROM L0 x 8'  seat 8  Step ups 6 laterally x 2/10 LLE Step hang calf stretch x 1' x 2 BLE Seated ham stretch x 1' x 2 LLE Reverse lunge x 15 BLE SLS on LLE w/ UE support and cone touch on floor x  3/10 Seated heel prop stretch x 1' LLE Seated heel slide flexion stretch x 1' LLE  NEUROMUSCULAR RE-EDUCATION: To improve balance. Foam stand EC x 1';  head turned to R, then L, then up x 20 sec ea Foam heel/toe rocking x 20 Foam SLS w/ toe touch contralateral LE x 1' BLE Tandem stance x 1' ea BLE  PATIENT EDUCATION:  Education details: reviewed forward and lateral step ups to do at home; [facility] flexion stretch Person educated: Patient Education method: Explanation, Demonstration, and Handouts Education comprehension: verbalized understanding, returned demonstration, and needs further education  HOME EXERCISE PROGRAM: Access Code: [email or web address] URL: [email or web address] Date: 07/03/2024 Prepared by: Gellen April Earnie Starring  Exercises - Supine Heel Slide with Strap  - 2 x daily - 7 x weekly - 1 sets - 10 reps - Supine Quad Set on Towel Roll  - 2-3 x daily - 7 x weekly - 2 sets - 10 reps - Heel Toe Raises with Counter Support  - 1 x daily - 7 x weekly - 2 sets - 10 reps - Standing Hip Abduction with Counter Support  - 1 x daily - 7 x weekly - 2 sets - 10 reps - Standing Hip Extension with Counter Support  - 1 x daily - 7 x weekly - 2 sets - 10 reps - Standing Hamstring Curl with Chair Support  - 1 x daily - 7 x weekly - 2  sets - 10 reps  ASSESSMENT:  CLINICAL IMPRESSION: Pt responded well to treatment, despite some increased fatigue and effort with interventions. Knee AROM looking very good at 0-123 deg today. Stairs look WFL. Pt is doing really well, would not be surprised if her surgeon releases her later today.  From eval: Patient is a 73 y.o. F who was seen today for physical therapy evaluation and treatment for L TKA on 06/27/24. Assessment is significant for reduced L knee ROM, strength, and stability affecting transfers and amb for home and community mobility. Pt will benefit from PT to address these deficits to maximize her level of function.   OBJECTIVE  IMPAIRMENTS: Abnormal gait, decreased activity tolerance, decreased balance, decreased coordination, decreased endurance, decreased mobility, difficulty walking, decreased ROM, decreased strength, hypomobility, increased edema, increased fascial restrictions, increased muscle spasms, improper body mechanics, postural dysfunction, and pain.   ACTIVITY LIMITATIONS: carrying, lifting, bending, sitting, standing, squatting, sleeping, stairs, transfers, bed mobility, bathing, toileting, dressing, hygiene/grooming, locomotion level, and caring for others  PARTICIPATION LIMITATIONS: meal prep, cleaning, laundry, driving, shopping, community activity, and yard work  PERSONAL FACTORS: Age, Fitness, Past/current experiences, and Time since onset of injury/illness/exacerbation are also affecting patient's functional outcome.   REHAB POTENTIAL: Good  CLINICAL DECISION MAKING: Evolving/moderate complexity  EVALUATION COMPLEXITY: Moderate   GOALS: Goals reviewed with patient? Yes  SHORT TERM GOALS: Target date: 08/07/2024  Pt will be ind with initial HEP Baseline: Goal status: MET  2.  Pt will demo L knee ROM from 5 to 115 deg Baseline: 07/27/24 = -5 to 120 deg 08/07/24:  -4 to 120 degrees Goal status: MET  3.  Pt will report reduction in pain by >/=50% Baseline: 4-8/10 variable 08/07/24:  3/10 WORST Goal status: MET  LONG TERM GOALS: Target date: 09/11/2024   Pt will be ind with management and progression of HEP Baseline: 07/27/24 needs assist for correct performance of balance exercises 08/07/24 reviewed and updated [employer] Go app Goal status: IN PROGRESS  2.  Pt will demo L knee ROM from 0 to at least 115 deg for stair negotiation Baseline: 07/27/24 = -5 to 120 08/10/24:  -3 to 125 degrees Goal status: MET  3.  Pt will be able to ascend/descend steps in a reciprocal fashion to demo increased quad control and strength Baseline: 07/27/24 1 flight with cane, rail, and CGA for reciprocal  pattern Goal status: IN PROGRESS  4.  Pt will have improved TUG to </=13 sec without a/d Baseline: 07/27/24 = 14.94 08/07/24:  11.33 sec Goal status: MET  5.  Pt will be able to amb >/=1000' independently for community mobility Baseline: 07/27/24- able to go to [facility] and complete 8 minutes of shopping Goal status: IN PROGRESS   6.  Pt will have improved LEFS to >/=24/80 to demo MCID Baseline: 15 07/27/24 = 41/80 Goal status: IN PROGRESS   PLAN:  PT FREQUENCY: 2x/week  PT DURATION: 10 weeks  PLANNED INTERVENTIONS: 97164- PT Re-evaluation, 97750- Physical Performance Testing, 97110-Therapeutic exercises, 97530- Therapeutic activity, 97112- Neuromuscular re-education, 97535- Self Care, [id number]- Manual therapy, [phone]- Gait training, [phone]- Aquatic Therapy, [phone]- Electrical stimulation (unattended), 97016- Vasopneumatic device, [id number]- Ultrasound, [id number]- Ionotophoresis 4mg /ml Dexamethasone , 79439 (1-2 muscles), [id number] (3+ muscles)- Dry Needling, Patient/Family education, Balance training, Stair training, Taping, Joint mobilization, Spinal mobilization, Cryotherapy, and Moist heat  PLAN FOR NEXT SESSION: continue to work on balance, L knee strength and ROM   Rethel Sebek L Annebelle Bostic, PTA 08/21/2024, 1:21 PM

## 2024-08-24 ENCOUNTER — Ambulatory Visit: Admitting: Rehabilitation
